# Patient Record
Sex: Male | Born: 1935 | Race: White | Hispanic: No | Marital: Married | Smoking: Former smoker
Health system: Southern US, Community
[De-identification: ages and names within clinical notes are randomized; demographics above are authoritative.]

## PROBLEM LIST (undated history)

## (undated) DIAGNOSIS — I10 Essential (primary) hypertension: Secondary | ICD-10-CM

## (undated) DIAGNOSIS — R519 Headache, unspecified: Secondary | ICD-10-CM

## (undated) DIAGNOSIS — F329 Major depressive disorder, single episode, unspecified: Secondary | ICD-10-CM

## (undated) DIAGNOSIS — R51 Headache: Secondary | ICD-10-CM

## (undated) DIAGNOSIS — I509 Heart failure, unspecified: Secondary | ICD-10-CM

## (undated) DIAGNOSIS — J45909 Unspecified asthma, uncomplicated: Secondary | ICD-10-CM

## (undated) DIAGNOSIS — G4730 Sleep apnea, unspecified: Secondary | ICD-10-CM

## (undated) DIAGNOSIS — G610 Guillain-Barre syndrome: Secondary | ICD-10-CM

## (undated) DIAGNOSIS — E119 Type 2 diabetes mellitus without complications: Secondary | ICD-10-CM

## (undated) DIAGNOSIS — I2699 Other pulmonary embolism without acute cor pulmonale: Secondary | ICD-10-CM

## (undated) DIAGNOSIS — I639 Cerebral infarction, unspecified: Secondary | ICD-10-CM

## (undated) DIAGNOSIS — I219 Acute myocardial infarction, unspecified: Secondary | ICD-10-CM

## (undated) DIAGNOSIS — M1990 Unspecified osteoarthritis, unspecified site: Secondary | ICD-10-CM

## (undated) DIAGNOSIS — F32A Depression, unspecified: Secondary | ICD-10-CM

## (undated) HISTORY — DX: Cerebral infarction, unspecified: I63.9

## (undated) HISTORY — DX: Heart failure, unspecified: I50.9

## (undated) HISTORY — PX: APPENDECTOMY: SHX54

---

## 2004-11-22 ENCOUNTER — Ambulatory Visit: Payer: Self-pay | Admitting: Internal Medicine

## 2007-04-29 ENCOUNTER — Ambulatory Visit: Admission: RE | Admit: 2007-04-29 | Discharge: 2007-04-29 | Payer: Self-pay | Admitting: Nurse Practitioner

## 2007-05-14 ENCOUNTER — Ambulatory Visit: Payer: Self-pay | Admitting: Pulmonary Disease

## 2010-06-07 ENCOUNTER — Ambulatory Visit: Payer: Self-pay | Admitting: Unknown Physician Specialty

## 2010-07-14 ENCOUNTER — Inpatient Hospital Stay: Payer: Self-pay | Admitting: Specialist

## 2010-08-14 HISTORY — PX: CERVICAL SPINE SURGERY: SHX589

## 2010-11-06 ENCOUNTER — Ambulatory Visit: Payer: Medicare Other | Attending: Nurse Practitioner

## 2010-11-06 DIAGNOSIS — G4733 Obstructive sleep apnea (adult) (pediatric): Secondary | ICD-10-CM | POA: Insufficient documentation

## 2010-11-06 DIAGNOSIS — Z7982 Long term (current) use of aspirin: Secondary | ICD-10-CM | POA: Insufficient documentation

## 2010-11-06 DIAGNOSIS — Z7902 Long term (current) use of antithrombotics/antiplatelets: Secondary | ICD-10-CM | POA: Insufficient documentation

## 2010-11-06 DIAGNOSIS — Z794 Long term (current) use of insulin: Secondary | ICD-10-CM | POA: Insufficient documentation

## 2010-11-06 DIAGNOSIS — I4949 Other premature depolarization: Secondary | ICD-10-CM | POA: Insufficient documentation

## 2010-11-22 ENCOUNTER — Ambulatory Visit: Payer: Medicare Other | Attending: Neurology

## 2010-11-22 DIAGNOSIS — Z794 Long term (current) use of insulin: Secondary | ICD-10-CM | POA: Insufficient documentation

## 2010-11-22 DIAGNOSIS — Z7982 Long term (current) use of aspirin: Secondary | ICD-10-CM | POA: Insufficient documentation

## 2010-11-22 DIAGNOSIS — Z7902 Long term (current) use of antithrombotics/antiplatelets: Secondary | ICD-10-CM | POA: Insufficient documentation

## 2010-11-22 DIAGNOSIS — I4949 Other premature depolarization: Secondary | ICD-10-CM | POA: Insufficient documentation

## 2010-11-22 DIAGNOSIS — G4733 Obstructive sleep apnea (adult) (pediatric): Secondary | ICD-10-CM | POA: Insufficient documentation

## 2010-11-29 NOTE — Procedures (Signed)
Colin Stokes, Colin Stokes              ACCOUNT NO.:  [id number]  MEDICAL RECORD NO.:  [id number]          PATIENT TYPE:  OUT  LOCATION:  SLEEP LAB                     FACILITY:  [facility]  PHYSICIAN:  Yaremi Stahlman A. Gerilyn Pilgrim, M.D. DATE OF BIRTH:  [birth year]  DATE OF STUDY:  11/22/2010                           NOCTURNAL POLYSOMNOGRAM  REFERRING PHYSICIAN:  Doristine Section  INDICATION FOR STUDY:  This is a 75 year old man who has had a previous study documented significant obstructive sleep apnea syndrome.  This is a titration study.  EPWORTH SLEEPINESS SCORE: 1. BMI 30.  MEDICATIONS:  Celexa, lisinopril, Lipitor, Plavix, metoprolol, aspirin, metformin, insulin, and ropinirole.  SLEEP ARCHITECTURE:  The total recording time is 403 minutes.  Sleep efficiency 84%, sleep latency 14 minutes, REM latency 92 minutes.  Stage N1 is 8% and N2 is 50%, N3 is 14% and REM sleep 28%.  RESPIRATORY DATA:  Baseline oxygen saturation is 96, lowest saturation 88 during REM sleep.  The patient was placed on positive pressure starting at a pressure of 5 and subsequently titrated to a pressure of 12.  The optimal pressure is 12 with resolution of obstructive event. He did tolerate this pressure well.  OXYGEN DATA:  CARDIAC DATA:  Average heart rate is 59 with some PVCs noted throughout the recording.  MOVEMENT-PARASOMNIA:  PLM index is 17.5.  The patient was also noted to have a fair amount of phasic EMG activity/fragmentary myoclonus activity, both within REM and nonREM sleep.  This has been associated with REM sleep behavior disorder.  IMPRESSIONS-RECOMMENDATIONS: 1. Obstructive sleep apnea syndrome which responded well to continuous     positive airway pressure of 12. 2. Moderate periodic limb movement disorder sleep. 3. Fragmentary myoclonus/phasic EMG activity. 4. Premature ventricular contractions.  CPAP of 12.  Thanks for this referral.     Maude Hettich A. Gerilyn Pilgrim, M.D. Electronically Signed 11/28/2010  12:27:14    KAD/MEDQ  D:  11/23/2010 09:81:19  T:  11/23/2010 23:00:54  Job:  [id number]

## 2010-12-27 NOTE — Procedures (Signed)
[name], Colin Stokes              ACCOUNT NO.:  [id number]   MEDICAL RECORD NO.:  [id number]          PATIENT TYPE:  OUT   LOCATION:  SLEEP LAB                     FACILITY:  [facility]   PHYSICIAN:  Barbaraann Share, MD,FCCPDATE OF BIRTH:  [birth year] 1938   DATE OF STUDY:  04/29/2007                            NOCTURNAL POLYSOMNOGRAM   REFERRING PHYSICIAN:  Ninfa Linden, M.D.   INDICATION FOR STUDY:  Hypersomnia with sleep apnea.   EPWORTH SLEEPINESS SCORE:  7   SLEEP ARCHITECTURE:  The patient had a total sleep time of 30 minutes  with adequate slow wave sleep but significantly decreased REM.  Sleep  onset latency was at the upper limits of normal, and REM onset was  prolonged at 200 minutes.  Sleep efficiency was decreased at 78%.   RESPIRATORY DATA:  The patient was found to have 62 obstructive  hypopneas and 43 obstructive apneas for an apnea/hypopnea index of 21  events per hour.  The events were not positional, and there was loud  snoring noted throughout.  The patient did not meet split night  criteria, secondary to most of the events occurring after 1:00 a.m.   OXYGEN DATA:  There was O2 desaturation as low as 72% with the patient's  obstructive events.   CARDIAC DATA:  Frequent PACs and PVCs were noted throughout.   MOVEMENT-PARASOMNIA:  The patient was found to have 183 leg jerks with  1.4 per hour, resulting in arousal or awakening.   IMPRESSIONS-RECOMMENDATIONS:  1. Moderate obstructive sleep apnea/hypopnea syndrome with an      apnea/hypopnea index of 21 events per hour and O2 desaturation as      low as 72%.  The patient did not meet split night criteria,      secondary to most of the events occurring after 1:00 a.m.      Treatment for this degree of sleep apnea can include weight loss      alone if applicable, upper airway surgery, oral appliance, and also      CPAP.  2. Frequent PVCs and PACs were noted throughout.  3. Large numbers of leg jerks with some degree of  sleep disruption.      It is unclear how much of this is related to the patient's sleep-      disordered breathing or whether he may have a concomitant primary      movement disorder at      sleep.  Clinical correlation is suggested, if the patient fails to      respond adequately to treatment of his obstructive sleep apnea.      Barbaraann Share, MD,FCCP  Diplomate, American Board of Sleep  Medicine  Electronically Signed     [facility]/MEDQ  D:  05/14/2007 07:48:59  T:  05/14/2007 09:53:59  Job:  [id number]

## 2013-06-26 ENCOUNTER — Ambulatory Visit: Payer: Self-pay | Admitting: Cardiology

## 2014-03-04 ENCOUNTER — Ambulatory Visit: Payer: Self-pay | Admitting: Unknown Physician Specialty

## 2014-03-05 LAB — PATHOLOGY REPORT

## 2015-09-28 ENCOUNTER — Inpatient Hospital Stay
Admission: AD | Admit: 2015-09-28 | Discharge: 2015-09-30 | DRG: 065 | Disposition: A | Discharge status: Home / Self Care | Payer: Medicare Other | Source: Ambulatory Visit | Attending: Internal Medicine | Admitting: Internal Medicine

## 2015-09-28 DIAGNOSIS — Z7982 Long term (current) use of aspirin: Secondary | ICD-10-CM | POA: Diagnosis not present

## 2015-09-28 DIAGNOSIS — Z87891 Personal history of nicotine dependence: Secondary | ICD-10-CM | POA: Diagnosis not present

## 2015-09-28 DIAGNOSIS — Z8249 Family history of ischemic heart disease and other diseases of the circulatory system: Secondary | ICD-10-CM | POA: Diagnosis not present

## 2015-09-28 DIAGNOSIS — Z794 Long term (current) use of insulin: Secondary | ICD-10-CM

## 2015-09-28 DIAGNOSIS — G650 Sequelae of Guillain-Barre syndrome: Secondary | ICD-10-CM | POA: Diagnosis present

## 2015-09-28 DIAGNOSIS — G6181 Chronic inflammatory demyelinating polyneuritis: Secondary | ICD-10-CM | POA: Diagnosis present

## 2015-09-28 DIAGNOSIS — M1990 Unspecified osteoarthritis, unspecified site: Secondary | ICD-10-CM | POA: Diagnosis present

## 2015-09-28 DIAGNOSIS — E119 Type 2 diabetes mellitus without complications: Secondary | ICD-10-CM | POA: Diagnosis present

## 2015-09-28 DIAGNOSIS — G610 Guillain-Barre syndrome: Secondary | ICD-10-CM | POA: Diagnosis present

## 2015-09-28 DIAGNOSIS — I63411 Cerebral infarction due to embolism of right middle cerebral artery: Secondary | ICD-10-CM | POA: Diagnosis not present

## 2015-09-28 DIAGNOSIS — I252 Old myocardial infarction: Secondary | ICD-10-CM | POA: Diagnosis not present

## 2015-09-28 DIAGNOSIS — I63531 Cerebral infarction due to unspecified occlusion or stenosis of right posterior cerebral artery: Principal | ICD-10-CM | POA: Diagnosis present

## 2015-09-28 DIAGNOSIS — F329 Major depressive disorder, single episode, unspecified: Secondary | ICD-10-CM | POA: Diagnosis present

## 2015-09-28 DIAGNOSIS — G4730 Sleep apnea, unspecified: Secondary | ICD-10-CM | POA: Diagnosis present

## 2015-09-28 DIAGNOSIS — J45909 Unspecified asthma, uncomplicated: Secondary | ICD-10-CM | POA: Diagnosis present

## 2015-09-28 DIAGNOSIS — I10 Essential (primary) hypertension: Secondary | ICD-10-CM | POA: Diagnosis present

## 2015-09-28 DIAGNOSIS — I2510 Atherosclerotic heart disease of native coronary artery without angina pectoris: Secondary | ICD-10-CM | POA: Diagnosis present

## 2015-09-28 DIAGNOSIS — Z79899 Other long term (current) drug therapy: Secondary | ICD-10-CM | POA: Diagnosis not present

## 2015-09-28 DIAGNOSIS — R531 Weakness: Secondary | ICD-10-CM

## 2015-09-28 DIAGNOSIS — I639 Cerebral infarction, unspecified: Secondary | ICD-10-CM

## 2015-09-28 HISTORY — DX: Type 2 diabetes mellitus without complications: E11.9

## 2015-09-28 HISTORY — DX: Depression, unspecified: F32.A

## 2015-09-28 HISTORY — DX: Acute myocardial infarction, unspecified: I21.9

## 2015-09-28 HISTORY — DX: Unspecified osteoarthritis, unspecified site: M19.90

## 2015-09-28 HISTORY — DX: Sleep apnea, unspecified: G47.30

## 2015-09-28 HISTORY — DX: Unspecified asthma, uncomplicated: J45.909

## 2015-09-28 HISTORY — DX: Headache, unspecified: R51.9

## 2015-09-28 HISTORY — DX: Headache: R51

## 2015-09-28 HISTORY — DX: Major depressive disorder, single episode, unspecified: F32.9

## 2015-09-28 LAB — CBC WITH DIFFERENTIAL/PLATELET
BASOS ABS: 0 10*3/uL (ref 0–0.1)
BASOS PCT: 1 %
EOS PCT: 3 %
Eosinophils Absolute: 0.2 10*3/uL (ref 0–0.7)
HCT: 38.7 % — ABNORMAL LOW (ref 40.0–52.0)
Hemoglobin: 12.8 g/dL — ABNORMAL LOW (ref 13.0–18.0)
LYMPHS PCT: 18 %
Lymphs Abs: 1.1 10*3/uL (ref 1.0–3.6)
MCH: 29.3 pg (ref 26.0–34.0)
MCHC: 33.2 g/dL (ref 32.0–36.0)
MCV: 88.4 fL (ref 80.0–100.0)
Monocytes Absolute: 0.7 10*3/uL (ref 0.2–1.0)
Monocytes Relative: 12 %
Neutro Abs: 4.1 10*3/uL (ref 1.4–6.5)
Neutrophils Relative %: 66 %
PLATELETS: 159 10*3/uL (ref 150–440)
RBC: 4.38 MIL/uL — AB (ref 4.40–5.90)
RDW: 14.1 % (ref 11.5–14.5)
WBC: 6.2 10*3/uL (ref 3.8–10.6)

## 2015-09-28 LAB — COMPREHENSIVE METABOLIC PANEL
ALK PHOS: 81 U/L (ref 38–126)
ALT: 28 U/L (ref 17–63)
ANION GAP: 9 (ref 5–15)
AST: 33 U/L (ref 15–41)
Albumin: 3.6 g/dL (ref 3.5–5.0)
BILIRUBIN TOTAL: 0.9 mg/dL (ref 0.3–1.2)
BUN: 25 mg/dL — AB (ref 6–20)
CALCIUM: 8.9 mg/dL (ref 8.9–10.3)
CO2: 28 mmol/L (ref 22–32)
Chloride: 101 mmol/L (ref 101–111)
Creatinine, Ser: 1.15 mg/dL (ref 0.61–1.24)
GFR calc Af Amer: >60 mL/min (ref >60)
GFR, EST NON AFRICAN AMERICAN: 60 mL/min — AB (ref >60)
Glucose, Bld: 171 mg/dL — ABNORMAL HIGH (ref 65–99)
POTASSIUM: 4.1 mmol/L (ref 3.5–5.1)
Sodium: 138 mmol/L (ref 135–145)
TOTAL PROTEIN: 6.7 g/dL (ref 6.5–8.1)

## 2015-09-28 LAB — GLUCOSE, CAPILLARY
GLUCOSE-CAPILLARY: 247 mg/dL — AB (ref 65–99)
Glucose-Capillary: 200 mg/dL — ABNORMAL HIGH (ref 65–99)

## 2015-09-28 MED ORDER — SODIUM CHLORIDE 0.9% FLUSH: 3.0000 mL | INTRAVENOUS | Status: DC | PRN

## 2015-09-28 MED ORDER — LOSARTAN POTASSIUM 50 MG PO TABS
100.0000 mg | ORAL_TABLET | Freq: Every day | ORAL | Status: DC
  Administered 2015-09-29 – 2015-09-30 (×2): 100 mg via ORAL
  Filled 2015-09-28 (×2): qty 2

## 2015-09-28 MED ORDER — ENOXAPARIN SODIUM 40 MG/0.4ML ~~LOC~~ SOLN
40.0000 mg | SUBCUTANEOUS | Status: DC
  Administered 2015-09-28 – 2015-09-30 (×3): 40 mg via SUBCUTANEOUS
  Filled 2015-09-28 (×3): qty 0.4

## 2015-09-28 MED ORDER — GABAPENTIN 400 MG PO CAPS
800.0000 mg | ORAL_CAPSULE | Freq: Every day | ORAL | Status: DC
  Administered 2015-09-28 – 2015-09-29 (×2): 800 mg via ORAL
  Filled 2015-09-28: qty 1
  Filled 2015-09-28 (×2): qty 2

## 2015-09-28 MED ORDER — INSULIN ASPART 100 UNIT/ML ~~LOC~~ SOLN: 0.0000 [IU] | Freq: Every day | SUBCUTANEOUS | Status: DC

## 2015-09-28 MED ORDER — ZOLPIDEM TARTRATE 5 MG PO TABS
5.0000 mg | ORAL_TABLET | Freq: Every evening | ORAL | Status: DC | PRN
  Administered 2015-09-28 – 2015-09-29 (×2): 5 mg via ORAL
  Filled 2015-09-28 (×2): qty 1

## 2015-09-28 MED ORDER — INSULIN ASPART 100 UNIT/ML ~~LOC~~ SOLN: 20.0000 [IU] | Freq: Every day | SUBCUTANEOUS | Status: DC

## 2015-09-28 MED ORDER — TRAMADOL HCL 50 MG PO TABS
50.0000 mg | ORAL_TABLET | Freq: Four times a day (QID) | ORAL | Status: DC | PRN
Start: 2015-09-28 — End: 2015-09-30

## 2015-09-28 MED ORDER — ACETAMINOPHEN 650 MG RE SUPP: 650.0000 mg | Freq: Four times a day (QID) | RECTAL | Status: DC | PRN

## 2015-09-28 MED ORDER — TRIAMCINOLONE ACETONIDE 0.1 % EX CREA
1.0000 "application " | TOPICAL_CREAM | Freq: Two times a day (BID) | CUTANEOUS | Status: DC
  Filled 2015-09-28: qty 15

## 2015-09-28 MED ORDER — IMMUNE GLOBULIN (HUMAN) 10 GM/100ML IV SOLN: 2.0000 g/kg | INTRAVENOUS | Status: DC

## 2015-09-28 MED ORDER — INSULIN DETEMIR 100 UNIT/ML ~~LOC~~ SOLN
36.0000 [IU] | Freq: Two times a day (BID) | SUBCUTANEOUS | Status: DC
  Administered 2015-09-28 – 2015-09-30 (×4): 36 [IU] via SUBCUTANEOUS
  Filled 2015-09-28 (×5): qty 0.36

## 2015-09-28 MED ORDER — INSULIN ASPART 100 UNIT/ML ~~LOC~~ SOLN
0.0000 [IU] | Freq: Three times a day (TID) | SUBCUTANEOUS | Status: DC
Start: 2015-09-28 — End: 2015-09-30
  Administered 2015-09-28: 5 [IU] via SUBCUTANEOUS
  Administered 2015-09-29 (×2): 3 [IU] via SUBCUTANEOUS
  Administered 2015-09-29: 5 [IU] via SUBCUTANEOUS
  Administered 2015-09-30: 3 [IU] via SUBCUTANEOUS
  Administered 2015-09-30: 2 [IU] via SUBCUTANEOUS
  Filled 2015-09-28 (×2): qty 3
  Filled 2015-09-28 (×2): qty 5
  Filled 2015-09-28: qty 2
  Filled 2015-09-28: qty 3

## 2015-09-28 MED ORDER — ALBUTEROL SULFATE (2.5 MG/3ML) 0.083% IN NEBU: 2.5000 mg | INHALATION_SOLUTION | Freq: Four times a day (QID) | RESPIRATORY_TRACT | Status: DC | PRN

## 2015-09-28 MED ORDER — SODIUM CHLORIDE 0.9 % IV SOLN: 250.0000 mL | INTRAVENOUS | Status: DC | PRN

## 2015-09-28 MED ORDER — PRAMIPEXOLE DIHYDROCHLORIDE 0.25 MG PO TABS
1.0000 mg | ORAL_TABLET | Freq: Three times a day (TID) | ORAL | Status: DC
  Administered 2015-09-28 – 2015-09-30 (×6): 1 mg via ORAL
  Filled 2015-09-28 (×6): qty 4

## 2015-09-28 MED ORDER — SODIUM CHLORIDE 0.9% FLUSH
3.0000 mL | Freq: Two times a day (BID) | INTRAVENOUS | Status: DC
  Administered 2015-09-29: 3 mL via INTRAVENOUS

## 2015-09-28 MED ORDER — ONDANSETRON HCL 4 MG/2ML IJ SOLN: 4.0000 mg | Freq: Four times a day (QID) | INTRAMUSCULAR | Status: DC | PRN

## 2015-09-28 MED ORDER — ASPIRIN EC 81 MG PO TBEC
81.0000 mg | DELAYED_RELEASE_TABLET | Freq: Every day | ORAL | Status: DC
  Administered 2015-09-29: 81 mg via ORAL
  Filled 2015-09-28: qty 1

## 2015-09-28 MED ORDER — GABAPENTIN 400 MG PO CAPS
500.0000 mg | ORAL_CAPSULE | Freq: Every day | ORAL | Status: DC
  Administered 2015-09-29 – 2015-09-30 (×2): 500 mg via ORAL
  Filled 2015-09-28 (×2): qty 1

## 2015-09-28 MED ORDER — PANTOPRAZOLE SODIUM 40 MG PO TBEC
40.0000 mg | DELAYED_RELEASE_TABLET | Freq: Every day | ORAL | Status: DC
  Administered 2015-09-29 – 2015-09-30 (×2): 40 mg via ORAL
  Filled 2015-09-28 (×2): qty 1

## 2015-09-28 MED ORDER — ATORVASTATIN CALCIUM 20 MG PO TABS
20.0000 mg | ORAL_TABLET | Freq: Every day | ORAL | Status: DC
  Administered 2015-09-29: 20 mg via ORAL
  Filled 2015-09-28: qty 1

## 2015-09-28 MED ORDER — ACETAMINOPHEN 325 MG PO TABS: 650.0000 mg | ORAL_TABLET | Freq: Four times a day (QID) | ORAL | Status: DC | PRN

## 2015-09-28 MED ORDER — INSULIN ASPART 100 UNIT/ML ~~LOC~~ SOLN
20.0000 [IU] | Freq: Every morning | SUBCUTANEOUS | Status: DC
  Administered 2015-09-29: 20 [IU] via SUBCUTANEOUS
  Filled 2015-09-28: qty 20

## 2015-09-28 MED ORDER — ONDANSETRON HCL 4 MG PO TABS: 4.0000 mg | ORAL_TABLET | Freq: Four times a day (QID) | ORAL | Status: DC | PRN

## 2015-09-28 MED ORDER — METFORMIN HCL 500 MG PO TABS
1000.0000 mg | ORAL_TABLET | Freq: Two times a day (BID) | ORAL | Status: DC
  Administered 2015-09-28 – 2015-09-30 (×4): 1000 mg via ORAL
  Filled 2015-09-28 (×4): qty 2

## 2015-09-28 MED ORDER — BISACODYL 5 MG PO TBEC: 5.0000 mg | DELAYED_RELEASE_TABLET | Freq: Every day | ORAL | Status: DC | PRN

## 2015-09-28 MED ORDER — IMMUNE GLOBULIN (HUMAN) 5 GM/50ML IV SOLN
500.0000 mg/kg | INTRAVENOUS | Status: DC
  Administered 2015-09-28: 55 g via INTRAVENOUS
  Filled 2015-09-28 (×2): qty 50

## 2015-09-28 MED ORDER — MAGNESIUM OXIDE 400 (241.3 MG) MG PO TABS
800.0000 mg | ORAL_TABLET | Freq: Every evening | ORAL | Status: DC
  Administered 2015-09-28 – 2015-09-29 (×2): 800 mg via ORAL
  Filled 2015-09-28 (×2): qty 2

## 2015-09-28 MED ORDER — CITALOPRAM HYDROBROMIDE 20 MG PO TABS
20.0000 mg | ORAL_TABLET | Freq: Every day | ORAL | Status: DC
  Administered 2015-09-29 – 2015-09-30 (×2): 20 mg via ORAL
  Filled 2015-09-28 (×2): qty 1

## 2015-09-28 MED ORDER — HYDROCHLOROTHIAZIDE 25 MG PO TABS
12.5000 mg | ORAL_TABLET | Freq: Every day | ORAL | Status: DC
  Administered 2015-09-29 – 2015-09-30 (×2): 12.5 mg via ORAL
  Filled 2015-09-28: qty 1
  Filled 2015-09-28: qty 2

## 2015-09-28 MED ORDER — DICLOFENAC SODIUM 1 % TD GEL
2.0000 g | Freq: Four times a day (QID) | TRANSDERMAL | Status: DC
  Filled 2015-09-28: qty 100

## 2015-09-28 MED ORDER — POLYETHYLENE GLYCOL 3350 17 G PO PACK: 17.0000 g | PACK | Freq: Every day | ORAL | Status: DC | PRN

## 2015-09-28 NOTE — H&P (Addendum)
[facility] - [place] at [facility]   PATIENT NAME: Colin Stokes    MR#:  [id number]  DATE OF BIRTH:  05/11/1936  DATE OF ADMISSION:  09/28/2015  PRIMARY CARE PHYSICIAN: SPARKS,JEFFREY D, MD   REQUESTING/REFERRING PHYSICIAN: Dr. Sherryll Burger  CHIEF COMPLAINT:  No chief complaint on file.  Weakness  HISTORY OF PRESENT ILLNESS:  Colin Stokes  is a 80 y.o. male with a known history of Guillain barre syndrome, CAD, HTN, DM here with vision changes, weakness, gait abnormalities and weakness. He was also briefly disoriented per wife. He went to ED in [place] and had CT head. Was seen by Dr. Sherryll Burger in office yesterday and sent as direct admission for IVIG.  Patient had Guillain barre 4 yrs back and spent 4 months at [facility] in ICU with Trach and PEG.   PAST MEDICAL HISTORY:   Past Medical History  Diagnosis Date  . Asthma   . Diabetes mellitus without complication (HCC)   . Depression   . Headache   . Arthritis   . Myocardial infarction (HCC)   . Sleep apnea     PAST SURGICAL HISTORY:   Past Surgical History  Procedure Laterality Date  . Appendectomy      SOCIAL HISTORY:   Social History  Substance Use Topics  . Smoking status: Former Games developer  . Smokeless tobacco: Not on file  . Alcohol Use: 0.0 oz/week    0 Standard drinks or equivalent per week    FAMILY HISTORY:   Family History  Problem Relation Age of Onset  . CAD      DRUG ALLERGIES:   Allergies  Allergen Reactions  . Penicillins Rash    REVIEW OF SYSTEMS:   Review of Systems  Constitutional: Positive for malaise/fatigue. Negative for fever, chills and weight loss.  HENT: Negative for hearing loss and nosebleeds.   Eyes: Positive for blurred vision and double vision. Negative for pain.  Respiratory: Negative for cough, hemoptysis, sputum production, shortness of breath and wheezing.   Cardiovascular: Negative for chest pain, palpitations, orthopnea and leg swelling.   Gastrointestinal: Negative for nausea, vomiting, abdominal pain, diarrhea and constipation.  Genitourinary: Negative for dysuria and hematuria.  Musculoskeletal: Negative for myalgias, back pain and falls.  Skin: Negative for rash.  Neurological: Positive for tingling, sensory change, focal weakness and headaches. Negative for dizziness, tremors, speech change and seizures.  Endo/Heme/Allergies: Does not bruise/bleed easily.  Psychiatric/Behavioral: Positive for hallucinations and memory loss. Negative for depression. The patient is not nervous/anxious.     MEDICATIONS AT HOME:   Prior to Admission medications   Medication Sig Start Date End Date Taking? Authorizing Provider  albuterol (PROVENTIL HFA;VENTOLIN HFA) 108 (90 Base) MCG/ACT inhaler Inhale 1 puff into the lungs every 6 (six) hours as needed for wheezing or shortness of breath.   Yes Historical Provider, MD  aspirin EC 81 MG tablet Take 81 mg by mouth daily.   Yes Historical Provider, MD  atorvastatin (LIPITOR) 20 MG tablet Take 20 mg by mouth daily.   Yes Historical Provider, MD  citalopram (CELEXA) 20 MG tablet Take 20 mg by mouth daily.   Yes Historical Provider, MD  colchicine 0.6 MG tablet Take 0.6 mg by mouth as needed.   Yes Historical Provider, MD  diclofenac sodium (VOLTAREN) 1 % GEL Apply topically 4 (four) times daily.   Yes Historical Provider, MD  gabapentin (NEURONTIN) 100 MG capsule Take 500 mg by mouth daily.   Yes Historical Provider, MD  hydrochlorothiazide (  HYDRODIURIL) 12.5 MG tablet Take 12.5 mg by mouth daily.   Yes Historical Provider, MD  insulin detemir (LEVEMIR) 100 UNIT/ML injection Inject 36 Units into the skin 2 (two) times daily.   Yes Historical Provider, MD  insulin regular (NOVOLIN R,HUMULIN R) 100 units/mL injection Inject 30 Units into the skin every morning.   Yes Historical Provider, MD  insulin regular (NOVOLIN R,HUMULIN R) 100 units/mL injection Inject 34 Units into the skin at bedtime.   Yes  Historical Provider, MD  lidocaine-prilocaine (EMLA) cream Apply 1 application topically as needed.   Yes Historical Provider, MD  losartan (COZAAR) 100 MG tablet Take 100 mg by mouth daily.   Yes Historical Provider, MD  magnesium oxide (MAG-OX) 400 MG tablet Take 800 mg by mouth every evening.   Yes Historical Provider, MD  metFORMIN (GLUCOPHAGE) 1000 MG tablet Take 1,000 mg by mouth 2 (two) times daily with a meal.   Yes Historical Provider, MD  omeprazole (PRILOSEC) 20 MG capsule Take 20 mg by mouth daily.   Yes Historical Provider, MD  pramipexole (MIRAPEX) 1 MG tablet Take 1 mg by mouth 3 (three) times daily.   Yes Historical Provider, MD  traMADol (ULTRAM) 50 MG tablet Take 50 mg by mouth every 6 (six) hours as needed.   Yes Historical Provider, MD  triamcinolone cream (KENALOG) 0.1 % Apply 1 application topically 2 (two) times daily.   Yes Historical Provider, MD      VITAL SIGNS:  Blood pressure 140/79, pulse 93, temperature 97.8 F (36.6 C), temperature source Oral, resp. rate 18, height  (1.803 m), weight 110.542 kg (243 lb 11.2 oz), SpO2 94 %.  PHYSICAL EXAMINATION:  Physical Exam  GENERAL:  80 y.o.-year-old patient lying in the bed with no acute distress.  EYES: Pupils equal, round, reactive to light and accommodation. No scleral icterus. Extraocular muscles intact.  HEENT: Head atraumatic, normocephalic. Oropharynx and nasopharynx clear. No oropharyngeal erythema, moist oral mucosa  NECK:  Supple, no jugular venous distention. No thyroid enlargement, no tenderness.  LUNGS: Normal breath sounds bilaterally, no wheezing, rales, rhonchi. No use of accessory muscles of respiration.  CARDIOVASCULAR: S1, S2 normal. No murmurs, rubs, or gallops.  ABDOMEN: Soft, nontender, nondistended. Bowel sounds present. No organomegaly or mass.  EXTREMITIES: No pedal edema, cyanosis, or clubbing. + 2 pedal & radial pulses b/l.   NEUROLOGIC: Cranial nerves II through XII are intact. No  focal Motor deficits appreciated b/l. Decreased sensations symmetrical LE PSYCHIATRIC: The patient is alert and oriented x 3. Good affect.  SKIN: No obvious rash, lesion, or ulcer.   LABORATORY PANEL:   CBC No results for input(s): WBC, HGB, HCT, PLT in the last 168 hours. ------------------------------------------------------------------------------------------------------------------  Chemistries  No results for input(s): NA, K, CL, CO2, GLUCOSE, BUN, CREATININE, CALCIUM, MG, AST, ALT, ALKPHOS, BILITOT in the last 168 hours.  Invalid input(s): GFRCGP ------------------------------------------------------------------------------------------------------------------  Cardiac Enzymes No results for input(s): TROPONINI in the last 168 hours. ------------------------------------------------------------------------------------------------------------------  RADIOLOGY:  No results found.   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 Discussed with Dr. Sherryll Burger. Start IVIG 2 mg/kg/day x 4 doses. Monitor symptoms.  * DM SSI, ADA diet  * CAD Stable Continue meds  * Asthma Stable  * DVT prophylaxis Lovenox   All the records are reviewed and case discussed with ED provider. Management plans discussed with the patient, family and they are in agreement.  CODE STATUS: FULL  TOTAL TIME TAKING CARE OF THIS PATIENT:  40 minutes.    Milagros Loll R M.D on 09/28/2015 at 1:54 PM  Between 7am to 6pm - Pager - [phone]  After 6pm go to [email or web address] - password [email or web address] [facility]  [facility]  Office  [phone]  CC: Primary care physician; Marguarite Arbour, MD   Note: This dictation was prepared with Dragon dictation along with smaller phrase technology. Any transcriptional errors that result from this process are unintentional.

## 2015-09-28 NOTE — Progress Notes (Signed)
Paged dr sudini. Pr having increased restless legs. Also he is missing his night time order for Neurontin and he is requesting something for sleep. New orders received

## 2015-09-29 ENCOUNTER — Inpatient Hospital Stay: Admission: AD | Admit: 2015-09-29 | Payer: Medicare Other | Source: Ambulatory Visit | Admitting: Internal Medicine

## 2015-09-29 ENCOUNTER — Inpatient Hospital Stay: Payer: Medicare Other

## 2015-09-29 ENCOUNTER — Encounter: Payer: Self-pay | Admitting: Radiology

## 2015-09-29 DIAGNOSIS — I63411 Cerebral infarction due to embolism of right middle cerebral artery: Secondary | ICD-10-CM

## 2015-09-29 LAB — GLUCOSE, CAPILLARY
GLUCOSE-CAPILLARY: 175 mg/dL — AB (ref 65–99)
Glucose-Capillary: 161 mg/dL — ABNORMAL HIGH (ref 65–99)
Glucose-Capillary: 204 mg/dL — ABNORMAL HIGH (ref 65–99)
Glucose-Capillary: 153 mg/dL — ABNORMAL HIGH (ref 65–99)
Glucose-Capillary: 111 mg/dL — ABNORMAL HIGH (ref 65–99)

## 2015-09-29 MED ORDER — ATORVASTATIN CALCIUM 20 MG PO TABS
40.0000 mg | ORAL_TABLET | Freq: Every day | ORAL | Status: DC
  Administered 2015-09-30: 40 mg via ORAL
  Filled 2015-09-29: qty 2

## 2015-09-29 MED ORDER — GADOBENATE DIMEGLUMINE 529 MG/ML IV SOLN
20.0000 mL | Freq: Once | INTRAVENOUS | Status: AC | PRN
  Administered 2015-09-29: 20 mL via INTRAVENOUS

## 2015-09-29 MED ORDER — TRIAMCINOLONE ACETONIDE 0.1 % EX CREA
1.0000 "application " | TOPICAL_CREAM | Freq: Two times a day (BID) | CUTANEOUS | Status: DC | PRN
  Filled 2015-09-29: qty 15

## 2015-09-29 MED ORDER — ASPIRIN EC 325 MG PO TBEC
325.0000 mg | DELAYED_RELEASE_TABLET | Freq: Every day | ORAL | Status: DC
  Administered 2015-09-29 – 2015-09-30 (×2): 325 mg via ORAL
  Filled 2015-09-29 (×2): qty 1

## 2015-09-29 MED ORDER — DICLOFENAC SODIUM 1 % TD GEL
2.0000 g | Freq: Four times a day (QID) | TRANSDERMAL | Status: DC | PRN
  Filled 2015-09-29: qty 100

## 2015-09-29 MED ORDER — IOHEXOL 350 MG/ML SOLN
100.0000 mL | Freq: Once | INTRAVENOUS | Status: AC | PRN
  Administered 2015-09-29: 100 mL via INTRAVENOUS

## 2015-09-29 MED ORDER — INSULIN ASPART 100 UNIT/ML ~~LOC~~ SOLN
15.0000 [IU] | Freq: Three times a day (TID) | SUBCUTANEOUS | Status: DC
  Administered 2015-09-29 – 2015-09-30 (×4): 15 [IU] via SUBCUTANEOUS
  Filled 2015-09-29 (×4): qty 15

## 2015-09-29 NOTE — Progress Notes (Signed)
[facility] - [place] at [facility]   PATIENT NAME: Colin Stokes    MR#:  [id number]  DATE OF BIRTH:  20-Sep-1935  SUBJECTIVE:  CHIEF COMPLAINT:  Patient is resting comfortably but reporting blurry vision, sometimes double vision and lower extremity weakness with tingling and numbness. Denies any dysphagia or dysarthria. Tolerating diet fine according to the nurse's report.  REVIEW OF SYSTEMS:  CONSTITUTIONAL: No fever, fatigue or weakness.  EYES: No blurred or double vision.  EARS, NOSE, AND THROAT: No tinnitus or ear pain.  RESPIRATORY: No cough, shortness of breath, wheezing or hemoptysis.  CARDIOVASCULAR: No chest pain, orthopnea, edema.  GASTROINTESTINAL: No nausea, vomiting, diarrhea or abdominal pain.  GENITOURINARY: No dysuria, hematuria.  ENDOCRINE: No polyuria, nocturia,  HEMATOLOGY: No anemia, easy bruising or bleeding SKIN: No rash or lesion. MUSCULOSKELETAL: No joint pain or arthritis.   NEUROLOGIC: Reporting tingling, numbness, weakness in lower extremities and blurry vision PSYCHIATRY: No anxiety or depression.   DRUG ALLERGIES:   Allergies  Allergen Reactions  . Penicillins Rash    VITALS:  Blood pressure 151/77, pulse 94, temperature 98.4 F (36.9 C), temperature source Oral, resp. rate 19, height 5\' 11"  (1.803 m), weight 110.542 kg (243 lb 11.2 oz), SpO2 95 %.  PHYSICAL EXAMINATION:  GENERAL:  80 y.o.-year-old patient lying in the bed with no acute distress.  EYES: Pupils equal, round, reactive to light and accommodation. No scleral icterus. Extraocular muscles intact.  HEENT: Head atraumatic, normocephalic. Oropharynx and nasopharynx clear.  NECK:  Supple, no jugular venous distention. No thyroid enlargement, no tenderness.  LUNGS: Normal breath sounds bilaterally, no wheezing, rales,rhonchi or crepitation. No use of accessory muscles of respiration.  CARDIOVASCULAR: S1, S2 normal. No murmurs, rubs, or gallops.  ABDOMEN: Soft,  nontender, nondistended. Bowel sounds present. No organomegaly or mass.  EXTREMITIES: No pedal edema, cyanosis, or clubbing.  NEUROLOGIC: Cranial nerves II through XII are intact. Muscle strength 5/5 in all extremities. Decreased touch sensation. Gait not checked.  PSYCHIATRIC: The patient is alert and oriented x 3.  SKIN: No obvious rash, lesion, or ulcer.    LABORATORY PANEL:   CBC  Recent Labs Lab 09/28/15 1347  WBC 6.2  HGB 12.8*  HCT 38.7*  PLT 159   ------------------------------------------------------------------------------------------------------------------  Chemistries   Recent Labs Lab 09/28/15 1347  NA 138  K 4.1  CL 101  CO2 28  GLUCOSE 171*  BUN 25*  CREATININE 1.15  CALCIUM 8.9  AST 33  ALT 28  ALKPHOS 81  BILITOT 0.9   ------------------------------------------------------------------------------------------------------------------  Cardiac Enzymes No results for input(s): TROPONINI in the last 168 hours. ------------------------------------------------------------------------------------------------------------------  RADIOLOGY:  Ct Angio Head W/cm &/or Wo Cm  09/29/2015  CLINICAL DATA:  80 year old male with acute right PCA infarct on MRI for weakness, visual changes, gait abnormality. Initial encounter. EXAM: CT ANGIOGRAPHY HEAD AND NECK TECHNIQUE: Multidetector CT imaging of the head and neck was performed using the standard protocol during bolus administration of intravenous contrast. Multiplanar CT image reconstructions and MIPs were obtained to evaluate the vascular anatomy. Carotid stenosis measurements (when applicable) are obtained utilizing NASCET criteria, using the distal internal carotid diameter as the denominator. CONTRAST:  OMNIPAQUE IOHEXOL 350 MG/ML SOLN COMPARISON:  Brain MRI 1151 hours today. FINDINGS: CT HEAD Brain: Patchy hypodensity in the right PCA territory corresponding to areas of restricted diffusion on the earlier  MRI. No associated hemorrhage or mass effect. No ventriculomegaly. Gray-white matter differentiation elsewhere is within normal limits. Calvarium and skull base:  No acute osseous abnormality identified. Paranasal sinuses: Clear. Orbits: Visualized orbits and scalp soft tissues are within normal limits. CTA NECK Skeleton: Previous posterior cervical spine decompression from the C3 to the C7 level. Chronic disc, endplate, and facet hypertrophy. Osteopenia. No acute osseous abnormality identified. Other neck: Mild motion artifact in the upper chest. No superior mediastinal lymphadenopathy. Thyroid, larynx, pharynx, parapharyngeal spaces, retropharyngeal space (retropharyngeal carotids), sublingual space, submandibular glands, and parotid glands are within normal limits. No cervical lymphadenopathy. Mildly conspicuous but nonspecific left level 1B lymph nodes. Aortic arch: 4 vessel arch configuration, with the left vertebral artery arising directly from the arch. Mild for age calcified arch atherosclerosis. Right carotid system: No brachiocephalic or right CCA origin stenosis. Mild motion artifact at the thoracic inlet. Retropharyngeal course of the right CCA and right carotid bifurcation with minimal calcified plaque. Tortuous cervical right ICA, no stenosis in the neck. Left carotid system: Mild calcified plaque in the proximal left CCA without stenosis. Partially retropharyngeal course. Widely patent left carotid bifurcation. Mildly dolichoectatic but otherwise negative cervical left ICA. Vertebral arteries: Calcified plaque in the proximal right subclavian artery without high-grade stenosis. Patent right vertebral artery origin with no stenosis evident. Otherwise negative cervical right vertebral artery. The left vertebral artery arises directly from the arch with no origin stenosis. It is tortuous in the V1 segment with a mildly late entry into the left cervical transverse foramen. No cervical left vertebral  artery stenosis. CTA HEAD Posterior circulation: Fairly codominant distal vertebral arteries. Calcified plaque in both V4 segments, greater on the right. This is not hemodynamically significant. Normal PICA origins and vertebrobasilar junction. There is mild irregularity and stenosis in the proximal basilar artery. The basilar remains patent. The SCA and PCA origins are within normal limits. Posterior communicating arteries are diminutive or absent. The left posterior communicating artery is present, the right is diminutive or absent. There is mild to moderate irregularity in the left PCA P2 segment with preserved distal flow. There is severe irregularity involving a 7 mm segment of the right P2 with high-grade stenosis, but distal enhancement is preserved. Anterior circulation: Both ICA siphons are patent with calcified plaque distally greater on the left. This does not appear hemodynamically significant. Ophthalmic and left posterior communicating artery origins are normal. Carotid termini are patent. MCA and ACA origins are normal. Anterior communicating artery and visualized ACA branches are within normal limits. There is a median artery of the corpus callosum. Left MCA M1 and trifurcation are within normal limits aside from mild dolichoectasia. Left MCA branches are within normal limits. Right MCA M1 segment, bifurcation, and right MCA branches are within normal limits. Venous sinuses: Patent. Anatomic variants: Left vertebral artery arises directly from the arch. Delayed phase: No abnormal enhancement identified. IMPRESSION: 1. Approximately 7 mm segment of severe irregularity and stenosis in the right PCA P2 segment corresponding to the acute right PCA infarct. 2. Upstream mild Distal vertebral artery and basilar atherosclerosis. There is mild to moderate irregularity and stenosis of the left PCA P2 segment. Note that the left vertebral artery arises directly from the aortic arch (normal variant). 3. Mild  calcified plaque involving the carotid arteries and anterior circulation with no associated stenosis. 4. Expected CT appearance of the acute right PCA infarcts seen by MRI today. No associated hemorrhage or mass effect. 5. No acute findings in the neck. Electronically Signed   By: Odessa Fleming M.D.   On: 09/29/2015 15:52   Ct Angio Neck W/cm &/or Wo/cm  09/29/2015  CLINICAL DATA:  80 year old male with acute right PCA infarct on MRI for weakness, visual changes, gait abnormality. Initial encounter. EXAM: CT ANGIOGRAPHY HEAD AND NECK TECHNIQUE: Multidetector CT imaging of the head and neck was performed using the standard protocol during bolus administration of intravenous contrast. Multiplanar CT image reconstructions and MIPs were obtained to evaluate the vascular anatomy. Carotid stenosis measurements (when applicable) are obtained utilizing NASCET criteria, using the distal internal carotid diameter as the denominator. CONTRAST:  OMNIPAQUE IOHEXOL 350 MG/ML SOLN COMPARISON:  Brain MRI 1151 hours today. FINDINGS: CT HEAD Brain: Patchy hypodensity in the right PCA territory corresponding to areas of restricted diffusion on the earlier MRI. No associated hemorrhage or mass effect. No ventriculomegaly. Gray-white matter differentiation elsewhere is within normal limits. Calvarium and skull base:  No acute osseous abnormality identified. Paranasal sinuses: Clear. Orbits: Visualized orbits and scalp soft tissues are within normal limits. CTA NECK Skeleton: Previous posterior cervical spine decompression from the C3 to the C7 level. Chronic disc, endplate, and facet hypertrophy. Osteopenia. No acute osseous abnormality identified. Other neck: Mild motion artifact in the upper chest. No superior mediastinal lymphadenopathy. Thyroid, larynx, pharynx, parapharyngeal spaces, retropharyngeal space (retropharyngeal carotids), sublingual space, submandibular glands, and parotid glands are within normal limits. No cervical  lymphadenopathy. Mildly conspicuous but nonspecific left level 1B lymph nodes. Aortic arch: 4 vessel arch configuration, with the left vertebral artery arising directly from the arch. Mild for age calcified arch atherosclerosis. Right carotid system: No brachiocephalic or right CCA origin stenosis. Mild motion artifact at the thoracic inlet. Retropharyngeal course of the right CCA and right carotid bifurcation with minimal calcified plaque. Tortuous cervical right ICA, no stenosis in the neck. Left carotid system: Mild calcified plaque in the proximal left CCA without stenosis. Partially retropharyngeal course. Widely patent left carotid bifurcation. Mildly dolichoectatic but otherwise negative cervical left ICA. Vertebral arteries: Calcified plaque in the proximal right subclavian artery without high-grade stenosis. Patent right vertebral artery origin with no stenosis evident. Otherwise negative cervical right vertebral artery. The left vertebral artery arises directly from the arch with no origin stenosis. It is tortuous in the V1 segment with a mildly late entry into the left cervical transverse foramen. No cervical left vertebral artery stenosis. CTA HEAD Posterior circulation: Fairly codominant distal vertebral arteries. Calcified plaque in both V4 segments, greater on the right. This is not hemodynamically significant. Normal PICA origins and vertebrobasilar junction. There is mild irregularity and stenosis in the proximal basilar artery. The basilar remains patent. The SCA and PCA origins are within normal limits. Posterior communicating arteries are diminutive or absent. The left posterior communicating artery is present, the right is diminutive or absent. There is mild to moderate irregularity in the left PCA P2 segment with preserved distal flow. There is severe irregularity involving a 7 mm segment of the right P2 with high-grade stenosis, but distal enhancement is preserved. Anterior circulation: Both  ICA siphons are patent with calcified plaque distally greater on the left. This does not appear hemodynamically significant. Ophthalmic and left posterior communicating artery origins are normal. Carotid termini are patent. MCA and ACA origins are normal. Anterior communicating artery and visualized ACA branches are within normal limits. There is a median artery of the corpus callosum. Left MCA M1 and trifurcation are within normal limits aside from mild dolichoectasia. Left MCA branches are within normal limits. Right MCA M1 segment, bifurcation, and right MCA branches are within normal limits. Venous sinuses: Patent. Anatomic variants: Left vertebral artery arises directly from  the arch. Delayed phase: No abnormal enhancement identified. IMPRESSION: 1. Approximately 7 mm segment of severe irregularity and stenosis in the right PCA P2 segment corresponding to the acute right PCA infarct. 2. Upstream mild Distal vertebral artery and basilar atherosclerosis. There is mild to moderate irregularity and stenosis of the left PCA P2 segment. Note that the left vertebral artery arises directly from the aortic arch (normal variant). 3. Mild calcified plaque involving the carotid arteries and anterior circulation with no associated stenosis. 4. Expected CT appearance of the acute right PCA infarcts seen by MRI today. No associated hemorrhage or mass effect. 5. No acute findings in the neck. Electronically Signed   By: Odessa Fleming M.D.   On: 09/29/2015 15:52   Mr Laqueta Jean ZO Contrast  09/29/2015  CLINICAL DATA:  Weakness, and gait abnormality, and visual changes. Brief disorientation. History of Guillain-Barre syndrome. EXAM: MRI HEAD WITHOUT AND WITH CONTRAST TECHNIQUE: Multiplanar, multiecho pulse sequences of the brain and surrounding structures were obtained without and with intravenous contrast. CONTRAST:  20mL MULTIHANCE GADOBENATE DIMEGLUMINE 529 MG/ML IV SOLN COMPARISON:  None. FINDINGS: There is right-sided posterior  circulation restricted diffusion consistent with acute infarction involving the mesial right temporal lobe and right occipital lobe. There is associated cytotoxic edema without mass effect. There is no evidence of intracranial hemorrhage, mass, midline shift, or extra-axial fluid collection. There is mild generalized cerebral atrophy. Small foci of T2 hyperintensity scattered throughout the cerebral white matter bilaterally are nonspecific but compatible with mild chronic small vessel ischemic disease. No abnormal enhancement is identified. Orbits are unremarkable. The been no significant inflammatory disease is seen in the paranasal sinuses are mastoid air cells. Major intracranial vascular flow voids are grossly preserved, although there is suggestion of abnormal FLAIR hyperintensity in the right PCA which may reflect slow flow or occlusion related to the acute infarct. A prior incision is noted in the midline soft tissues of the upper neck posteriorly. IMPRESSION: Acute right PCA infarct involving the temporal and occipital lobes. Electronically Signed   By: Sebastian Ache M.D.   On: 09/29/2015 12:56    EKG:   Orders placed or performed in visit on 07/14/10  . EKG 12-Lead  . EKG 12-Lead  . 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 Discussed with Dr. Sherryll Burger. Started patient on IVIG 2 mg/kg/day at the time of admission but it was discontinued as the patient has acute stroke, per neurology recommendations Monitor  symptoms.  * DM SSI, ADA diet   * CAD Stable Continue meds  * Asthma Stable  * DVT prophylaxis Lovenox    All the records are reviewed and case discussed with RN Dava, Care Management/Social Workerr. Management plans discussed with the patient, family and they are in agreement.  CODE STATUS: fc   TOTAL TIME TAKING CARE OF THIS PATIENT: 35  minutes.   POSSIBLE D/C IN 1-2  DAYS, DEPENDING ON CLINICAL CONDITION.   Ramonita Lab M.D on 09/29/2015 at 4:28 PM  Between 7am to 6pm - Pager - [phone] After 6pm  go to [email or web address] - password [email or web address] [facility]  [facility]  Office  [phone]  CC: Primary care physician; Marguarite Arbour, MD

## 2015-09-29 NOTE — Evaluation (Signed)
Physical Therapy Evaluation Patient Details Name: Colin Stokes MRN: [id number] DOB: 1935-12-29 Today's Date: 09/29/2015   History of Present Illness  Pt admitted for complaints of weakness with vison/gait deficits. Pt with history of Guillian Barre and CIDP. Pt now diagnosed with positive CVA in R PCA. Pt with history of CAD, HTN, and DM.   Clinical Impression  Pt is a pleasant 80 year old male who was admitted for possible CIDP but now is ruled in for positive CVA. Pt performs bed mobility/transfers independently and ambulation with cga and no AD. Post ambulation, pt very fatigued with O2 sats at 86% on room air. 2L of O2 reapplied. Pt demonstrates deficits with balance/mobility/endurance. Pt with negative heel->shin, finger opposition, and finger->nose testing. No changes to B UE/LE sensation. Would benefit from skilled PT to address above deficits and promote optimal return to PLOF.      Follow Up Recommendations Outpatient PT (want to use OP close to home)    Equipment Recommendations  None recommended by PT    Recommendations for Other Services       Precautions / Restrictions Precautions Precautions: Fall Restrictions Weight Bearing Restrictions: No      Mobility  Bed Mobility Overal bed mobility: Independent             General bed mobility comments: safe technique performed  Transfers Overall transfer level: Independent Equipment used: None             General transfer comment: safe technique performed without use of AD.  Ambulation/Gait Ambulation/Gait assistance: Min guard Ambulation Distance (Feet): 200 Feet Assistive device: None Gait Pattern/deviations: Step-through pattern     General Gait Details: Pt ambulates with wide BOS and slow gait speed. Pt unsteady and frequently loses balance in post lateral direction with changes in direction. Pt able to correct LOB with cga. Pt also grabbing towards railing/walls occasionally.   Stairs             Wheelchair Mobility    Modified Rankin (Stroke Patients Only)       Balance Overall balance assessment: History of Falls;Needs assistance Sitting-balance support: Feet supported Sitting balance-Leahy Scale: Normal     Standing balance support: No upper extremity supported Standing balance-Leahy Scale: Fair Standing balance comment: increased sway during static stance and multiple LOB during dynamic testing                             Pertinent Vitals/Pain Pain Assessment: Faces Faces Pain Scale: Hurts a little bit Pain Location: Plantar surfaces of B feet with weight bearing Pain Descriptors / Indicators: Burning Pain Intervention(s): Limited activity within patient's tolerance    Home Living Family/patient expects to be discharged to:: Private residence Living Arrangements: Spouse/significant other Available Help at Discharge: Family Type of Home: House Home Access: Ramped entrance     Home Layout: One level Home Equipment: Environmental consultant - 2 wheels;Bedside commode;Shower seat      Prior Function Level of Independence: Independent         Comments: was previously playing golf     Hand Dominance        Extremity/Trunk Assessment   Upper Extremity Assessment: Overall WFL for tasks assessed           Lower Extremity Assessment: Overall WFL for tasks assessed         Communication   Communication: No difficulties  Cognition Arousal/Alertness: Awake/alert Behavior During Therapy: WFL for tasks assessed/performed Overall  Cognitive Status: Within Functional Limits for tasks assessed                      General Comments      Exercises Other Exercises Other Exercises: Pt ambulated to bathroom and needs cga to maintain balance while using toilet. Supervision given for personal hygiene.      Assessment/Plan    PT Assessment Patient needs continued PT services  PT Diagnosis Difficulty walking;Abnormality of gait   PT  Problem List Decreased balance;Decreased mobility;Decreased coordination  PT Treatment Interventions Gait training;DME instruction;Functional mobility training;Balance training   PT Goals (Current goals can be found in the Care Plan section) Acute Rehab PT Goals Patient Stated Goal: to play golf PT Goal Formulation: With patient Time For Goal Achievement: 10/13/15 Potential to Achieve Goals: Good    Frequency 7X/week   Barriers to discharge        Co-evaluation               End of Session Equipment Utilized During Treatment: Gait belt;Oxygen Activity Tolerance: Patient tolerated treatment well Patient left: in bed;with bed alarm set Nurse Communication: Mobility status         Time: 0102-7253 PT Time Calculation (min) (ACUTE ONLY): 22 min   Charges:   PT Evaluation $PT Eval Moderate Complexity: 1 Procedure PT Treatments $Therapeutic Activity: 8-22 mins   PT G Codes:        [name] Oct 15, 2015, 4:49 PM  Elizabeth Palau, PT, DPT [phone]

## 2015-09-29 NOTE — Progress Notes (Signed)
Inpatient Diabetes Program Recommendations  AACE/ADA: New Consensus Statement on Inpatient Glycemic Control (2015)  Target Ranges:  Prepandial:   less than 140 mg/dL      Peak postprandial:   less than 180 mg/dL (1-2 hours)      Critically ill patients:  140 - 180 mg/dL   Review of Glycemic Control:  Results for KAYSHAUN, POLANCO (MRN [id number]) as of 09/29/2015 10:48  Ref. Range 09/28/2015 16:06 09/28/2015 21:05 09/29/2015 07:46 09/29/2015 09:18  Glucose-Capillary Latest Ref Range: 65-99 mg/dL 409 (H) 811 (H) 914 (H) 204 (H)   Diabetes history: Type 2 diabetes Outpatient Diabetes medications: Levemir 36 units bid, Novolin R- 30 units q AM and 34 units q HS Current orders for Inpatient glycemic control:  Novolog moderate tid with meals and HS, Levemir 36 units bid, Novolog 20 units q 10 am, Novolog 20 units q HS  Inpatient Diabetes Program Recommendations:    Please discontinue Novolog at HS.  Consider changing times for Novolog to occur with meals.  Consider Novolog meal coverage 15 units tid with meals- therefore if patient does not eat more that 50%, Novolog meal coverage can be held.  Thanks, Beryl Meager, RN, BC-ADM Inpatient Diabetes Coordinator Pager [phone] (8a-5p)

## 2015-09-29 NOTE — Consult Note (Signed)
CC: LE weakness   HPI: Colin Stokes is an 80 y.o. male male with a known history of Guillain barre syndrome, CAD, HTN, DM here with vision changes, weakness, gait abnormalities and weakness. He was also briefly disoriented per wife. He went to ED in [place] and had CT head. Was seen by Dr. Sherryll Burger in office yesterday and sent as direct admission for IVIG.  Pt states he ws treated at [facility] bout 4 yrs ago, at that time had b/l LE numbness which is similar to what he had at that time.    Past Medical History  Diagnosis Date  . Asthma   . Diabetes mellitus without complication (HCC)   . Depression   . Headache   . Arthritis   . Myocardial infarction (HCC)   . Sleep apnea     Past Surgical History  Procedure Laterality Date  . Appendectomy      Family History  Problem Relation Age of Onset  . CAD      Social History:  reports that he has quit smoking. He does not have any smokeless tobacco history on file. He reports that he drinks alcohol. His drug history is not on file.  Allergies  Allergen Reactions  . Penicillins Rash    Medications: I have reviewed the patient's current medications.  ROS: History obtained from the patient  General ROS: negative for - chills, fatigue, fever, night sweats, weight gain or weight loss Psychological ROS: negative for - behavioral disorder, hallucinations, memory difficulties, mood swings or suicidal ideation Ophthalmic ROS: negative for - blurry vision, double vision, eye pain or loss of vision ENT ROS: negative for - epistaxis, nasal discharge, oral lesions, sore throat, tinnitus or vertigo Allergy and Immunology ROS: negative for - hives or itchy/watery eyes Hematological and Lymphatic ROS: negative for - bleeding problems, bruising or swollen lymph nodes Endocrine ROS: negative for - galactorrhea, hair pattern changes, polydipsia/polyuria or temperature intolerance Respiratory ROS: negative for - cough, hemoptysis, shortness of  breath or wheezing Cardiovascular ROS: negative for - chest pain, dyspnea on exertion, edema or irregular heartbeat Gastrointestinal ROS: negative for - abdominal pain, diarrhea, hematemesis, nausea/vomiting or stool incontinence Genito-Urinary ROS: negative for - dysuria, hematuria, incontinence or urinary frequency/urgency Musculoskeletal ROS: negative for - joint swelling or muscular weakness Neurological ROS: as noted in HPI, positive history of GBS Dermatological ROS: negative for rash and skin lesion changes  Physical Examination: Blood pressure 177/95, pulse 82, temperature 97.1 F (36.2 C), temperature source Oral, resp. rate 19, height  (1.803 m), weight 243 lb 11.2 oz (110.542 kg), SpO2 92 %.    Neurological Examination Mental Status: Alert, oriented, thought content appropriate.  Speech fluent without evidence of aphasia.  Able to follow 3 step commands without difficulty. Cranial Nerves: II: Discs flat bilaterally; Visual fields grossly normal, pupils equal, round, reactive to light and accommodation III,IV, VI: ptosis not present, extra-ocular motions intact bilaterally V,VII: smile symmetric, facial light touch sensation normal bilaterally VIII: hearing normal bilaterally IX,X: gag reflex present XI: bilateral shoulder shrug XII: midline tongue extension Motor: Right : Upper extremity   5/5    Left:     Upper extremity   5/5  Lower extremity   5/5     Lower extremity   5/5 Tone and bulk:normal tone throughout; no atrophy noted Sensory: Pinprick and light touch intact throughout, bilaterally Deep Tendon Reflexes: 1+ in upper extremities, 1+ in patella and absent in the ankle Plantars: Right: downgoing   Left:  downgoing Cerebellar: normal finger-to-nose, normal rapid alternating movements and normal heel-to-shin test Gait: normal gait and station      Laboratory Studies:   Basic Metabolic Panel:  Recent Labs Lab 09/28/15 1347  NA 138  K 4.1  CL 101   CO2 28  GLUCOSE 171*  BUN 25*  CREATININE 1.15  CALCIUM 8.9    Liver Function Tests:  Recent Labs Lab 09/28/15 1347  AST 33  ALT 28  ALKPHOS 81  BILITOT 0.9  PROT 6.7  ALBUMIN 3.6   No results for input(s): LIPASE, AMYLASE in the last 168 hours. No results for input(s): AMMONIA in the last 168 hours.  CBC:  Recent Labs Lab 09/28/15 1347  WBC 6.2  NEUTROABS 4.1  HGB 12.8*  HCT 38.7*  MCV 88.4  PLT 159    Cardiac Enzymes: No results for input(s): CKTOTAL, CKMB, CKMBINDEX, TROPONINI in the last 168 hours.  BNP: Invalid input(s): POCBNP  CBG:  Recent Labs Lab 09/28/15 1606 09/28/15 2105 09/29/15 0746 09/29/15 0918  GLUCAP 247* 200* 161* 204*    Microbiology: No results found for this or any previous visit.  Coagulation Studies: No results for input(s): LABPROT, INR in the last 72 hours.  Urinalysis: No results for input(s): COLORURINE, LABSPEC, PHURINE, GLUCOSEU, HGBUR, BILIRUBINUR, KETONESUR, PROTEINUR, UROBILINOGEN, NITRITE, LEUKOCYTESUR in the last 168 hours.  Invalid input(s): APPERANCEUR  Lipid Panel:  No results found for: CHOL, TRIG, HDL, CHOLHDL, VLDL, LDLCALC  HgbA1C: No results found for: HGBA1C  Urine Drug Screen:  No results found for: LABOPIA, COCAINSCRNUR, LABBENZ, AMPHETMU, THCU, LABBARB  Alcohol Level: No results for input(s): ETH in the last 168 hours.  Other results: EKG: normal EKG, normal sinus rhythm, unchanged from previous tracings.  Imaging: No results found.   Assessment/Plan:  80 y.o. male male with a known history of Guillain barre syndrome, CAD, HTN, DM here with vision changes, weakness, gait abnormalities and weakness. He was also briefly disoriented per wife. He went to ED in [place] and had CT head. Was seen by Dr. Sherryll Burger in office yesterday and sent as direct admission for IVIG.  Pt states he ws treated at [facility] bout 4 yrs ago, at that time had b/l LE numbness which is similar to what he had at that time.      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  Pt does have LE numbness.   - will d/c IVIG as can cause increased hypercoagulable state in setting of history, CAD, HTN, DM - stroke work up that includes Pt/ot, 2dEcho, HbA1c, CTA h/n - [place] and VC q 8 hrs - if symptoms worsen and appear similar to GBS with assenting paralysis and worsening NIFF, VC would transfer pt for plasmapheresis instead of IVIG as I don't believe Plasmapheresis is offered at [facility], [place]    09/29/2015, 12:24 PM

## 2015-09-29 NOTE — Progress Notes (Signed)
°   09/29/15 1000  Clinical Encounter Type  Visited With Patient and family together  Visit Type Follow-up  Referral From Nurse  Consult/Referral To Chaplain  Spiritual Encounters  Spiritual Needs Other (Comment)  Stress Factors  Patient Stress Factors Health changes  Family Stress Factors Not reviewed  Completed AD documents - notarized and filed in chart. Chaplain Larose Kells Ext [phone]

## 2015-09-29 NOTE — Progress Notes (Signed)
Dr Amado Coe on rounds . Pt not taking topical agents but only prn. Orders chg to prn. Pt uses o2 2liters at home prn when sleeping and qhs. Orders chg to reflect above

## 2015-09-29 NOTE — Progress Notes (Signed)
NIF -30 best of 3, VC 60% best of 3.

## 2015-09-29 NOTE — NC FL2 (Signed)
[place] MEDICAID FL2 LEVEL OF CARE SCREENING TOOL     IDENTIFICATION  Patient Name: Colin Stokes Birthdate: 1935/09/29 Sex: male Admission Date (Current Location): 09/28/2015  [place] Number:  Recruitment consultant and Address:  [facility], [address], [place], [place] [address]      Provider Number: [id number]  Attending Physician Name and Address:  Ramonita Lab, MD  Relative Name and Phone Number:       Current Level of Care: Hospital Recommended Level of Care: Skilled Nursing Facility Prior Approval Number:    Date Approved/Denied:   PASRR Number:  ( [id number] )  Discharge Plan: SNF    Current Diagnoses: Patient Active Problem List   Diagnosis Date Noted  . CIDP (chronic inflammatory demyelinating polyneuropathy) (HCC) 09/28/2015    Orientation RESPIRATION BLADDER Height & Weight     Self, Time, Situation, Place  Normal Continent Weight: 243 lb 11.2 oz (110.542 kg) Height:   (180.3 cm)  BEHAVIORAL SYMPTOMS/MOOD NEUROLOGICAL BOWEL NUTRITION STATUS   (none )  (none ) Continent Diet (Diet: Heart Healthy/ Carb Modified )  AMBULATORY STATUS COMMUNICATION OF NEEDS Skin   Extensive Assist Verbally Normal                       Personal Care Assistance Level of Assistance  Bathing, Feeding, Dressing Bathing Assistance: Limited assistance Feeding assistance: Independent Dressing Assistance: Limited assistance     Functional Limitations Info  Sight, Hearing, Speech Sight Info: Adequate Hearing Info: Adequate Speech Info: Adequate    SPECIAL CARE FACTORS FREQUENCY  PT (By licensed PT)     PT Frequency:  (5)              Contractures      Additional Factors Info  Code Status, Allergies, Insulin Sliding Scale Code Status Info:  (Full Code. ) Allergies Info:  (Penicillins)   Insulin Sliding Scale Info:  (NovoLog Insulin Injections )       Current Medications (09/29/2015):  This is the  current hospital active medication list Current Facility-Administered Medications  Medication Dose Route Frequency Provider Last Rate Last Dose  . 0.9 %  sodium chloride infusion  250 mL Intravenous PRN Milagros Loll, MD      . acetaminophen (TYLENOL) tablet 650 mg  650 mg Oral Q6H PRN Milagros Loll, MD       Or  . acetaminophen (TYLENOL) suppository 650 mg  650 mg Rectal Q6H PRN Srikar Sudini, MD      . albuterol (PROVENTIL) (2.5 MG/3ML) 0.083% nebulizer solution 2.5 mg  2.5 mg Inhalation Q6H PRN Milagros Loll, MD      . aspirin EC tablet 81 mg  81 mg Oral Daily Milagros Loll, MD   81 mg at 09/29/15 0806  . atorvastatin (LIPITOR) tablet 20 mg  20 mg Oral Daily Milagros Loll, MD   20 mg at 09/29/15 0806  . bisacodyl (DULCOLAX) EC tablet 5 mg  5 mg Oral Daily PRN Srikar Sudini, MD      . citalopram (CELEXA) tablet 20 mg  20 mg Oral Daily Milagros Loll, MD   20 mg at 09/29/15 0806  . diclofenac sodium (VOLTAREN) 1 % transdermal gel 2 g  2 g Topical QID Milagros Loll, MD   2 g at 09/28/15 1740  . enoxaparin (LOVENOX) injection 40 mg  40 mg Subcutaneous Q24H Milagros Loll, MD   40 mg at 09/29/15 1308  . gabapentin (NEURONTIN) capsule 500  mg  500 mg Oral Daily Milagros Loll, MD   500 mg at 09/29/15 0808  . gabapentin (NEURONTIN) capsule 800 mg  800 mg Oral QHS Srikar Sudini, MD   800 mg at 09/28/15 2147  . hydrochlorothiazide (HYDRODIURIL) tablet 12.5 mg  12.5 mg Oral Daily Milagros Loll, MD   12.5 mg at 09/29/15 0806  . insulin aspart (novoLOG) injection 0-15 Units  0-15 Units Subcutaneous TID WC Milagros Loll, MD   3 Units at 09/29/15 1308  . insulin aspart (novoLOG) injection 15 Units  15 Units Subcutaneous TID WC Ramonita Lab, MD   15 Units at 09/29/15 1307  . insulin detemir (LEVEMIR) injection 36 Units  36 Units Subcutaneous BID Milagros Loll, MD   36 Units at 09/29/15 0929  . losartan (COZAAR) tablet 100 mg  100 mg Oral Daily Milagros Loll, MD   100 mg at 09/29/15 0805  . magnesium oxide  (MAG-OX) tablet 800 mg  800 mg Oral QPM Srikar Sudini, MD   800 mg at 09/28/15 1736  . metFORMIN (GLUCOPHAGE) tablet 1,000 mg  1,000 mg Oral BID WC Milagros Loll, MD   1,000 mg at 09/29/15 0807  . ondansetron (ZOFRAN) tablet 4 mg  4 mg Oral Q6H PRN Milagros Loll, MD       Or  . ondansetron (ZOFRAN) injection 4 mg  4 mg Intravenous Q6H PRN Srikar Sudini, MD      . pantoprazole (PROTONIX) EC tablet 40 mg  40 mg Oral Daily Milagros Loll, MD   40 mg at 09/29/15 0807  . polyethylene glycol (MIRALAX / GLYCOLAX) packet 17 g  17 g Oral Daily PRN Srikar Sudini, MD      . pramipexole (MIRAPEX) tablet 1 mg  1 mg Oral TID Milagros Loll, MD   1 mg at 09/29/15 0805  . sodium chloride flush (NS) 0.9 % injection 3 mL  3 mL Intravenous Q12H Srikar Sudini, MD   3 mL at 09/29/15 0930  . sodium chloride flush (NS) 0.9 % injection 3 mL  3 mL Intravenous PRN Srikar Sudini, MD      . traMADol (ULTRAM) tablet 50 mg  50 mg Oral Q6H PRN Srikar Sudini, MD      . triamcinolone cream (KENALOG) 0.1 % 1 application  1 application Topical BID Milagros Loll, MD   1 application at 09/28/15 2139  . zolpidem (AMBIEN) tablet 5 mg  5 mg Oral QHS PRN Milagros Loll, MD   5 mg at 09/28/15 2137     Discharge Medications: Please see discharge summary for a list of discharge medications.  Relevant Imaging Results:  Relevant Lab Results:   Additional Information  (SSN: [id number])  Haig Prophet, LCSW

## 2015-09-29 NOTE — Progress Notes (Signed)
PT Cancellation Note  Patient Details Name: DYLEN MCELHANNON MRN: [id number] DOB: [birth year]   Cancelled Treatment:    Reason Eval/Treat Not Completed: Other (comment). Evaluation attempted, however pt going out of room for MRI imaging. RN asked to come back at another time. Will re-attempt, time permitting.   Vertis Bauder 09/29/2015, 10:59 AM Elizabeth Palau, PT, DPT [phone]

## 2015-09-30 ENCOUNTER — Inpatient Hospital Stay
Admission: AD | Admit: 2015-09-30 | Discharge: 2015-09-30 | Disposition: A | Discharge status: Home / Self Care | Payer: Medicare Other | Source: Ambulatory Visit | Attending: Internal Medicine | Admitting: Internal Medicine

## 2015-09-30 DIAGNOSIS — I639 Cerebral infarction, unspecified: Secondary | ICD-10-CM

## 2015-09-30 LAB — GLUCOSE, CAPILLARY
GLUCOSE-CAPILLARY: 159 mg/dL — AB (ref 65–99)
Glucose-Capillary: 122 mg/dL — ABNORMAL HIGH (ref 65–99)

## 2015-09-30 LAB — LIPID PANEL
CHOLESTEROL: 166 mg/dL (ref 0–200)
HDL: 53 mg/dL (ref >40)
LDL Cholesterol: 71 mg/dL (ref 0–99)
TRIGLYCERIDES: 210 mg/dL — AB (ref <150)
Total CHOL/HDL Ratio: 3.1 RATIO
VLDL: 42 mg/dL — ABNORMAL HIGH (ref 0–40)

## 2015-09-30 LAB — HEMOGLOBIN A1C: Hgb A1c MFr Bld: 6.9 % — ABNORMAL HIGH (ref 4.0–6.0)

## 2015-09-30 MED ORDER — PERFLUTREN LIPID MICROSPHERE
1.0000 mL | INTRAVENOUS | Status: AC | PRN
  Filled 2015-09-30: qty 10

## 2015-09-30 MED ORDER — ATORVASTATIN CALCIUM 40 MG PO TABS: 40.0000 mg | ORAL_TABLET | Freq: Every day | ORAL | Status: AC

## 2015-09-30 MED ORDER — CLOPIDOGREL BISULFATE 75 MG PO TABS: 75.0000 mg | ORAL_TABLET | Freq: Every day | ORAL | Status: AC

## 2015-09-30 MED ORDER — GABAPENTIN 400 MG PO CAPS: 800.0000 mg | ORAL_CAPSULE | Freq: Every day | ORAL | Status: DC

## 2015-09-30 NOTE — Discharge Summary (Signed)
[facility] - [place] [facility], [facility]   PATIENT NAME: Colin Stokes    MR#:  [id number]  DATE OF BIRTH:  24-Oct-1935  DATE OF ADMISSION:  09/28/2015 ADMITTING PHYSICIAN: Adrian Saran, MD  DATE OF DISCHARGE: 09/30/2015 PRIMARY CARE PHYSICIAN: SPARKS,JEFFREY D, MD    ADMISSION DIAGNOSIS:  CIDP  IVIG  DISCHARGE DIAGNOSIS:  Acute CVA-with acute right PCA infarct  SECONDARY DIAGNOSIS:   Past Medical History  Diagnosis Date  . Asthma   . Diabetes mellitus without complication (HCC)   . Depression   . Headache   . Arthritis   . Myocardial infarction (HCC)   . Sleep apnea     HOSPITAL COURSE:  * Lower extremity weakness and blurry vision  from acute right PCA infarct  CT angiogram of the head and neck with acute right PCA infarct and no significant carotid artery stenosis Echocardiogram-technically difficult study with left ventricular ejection fraction 30-35% VIG is discontinued Patient is on aspirin and statin. Neurology Dr.Reynolds  has recommended to start the patient on Plavix PT consult -recommending outpatient PT Appreciate neurology recommendations, patient is to follow-up with his primary neurologist Dr. Sherryll Burger in a week Continue Lipitor   fasting lipid panel -LDL-71  hemoglobin A1c is done but the result is not going to be there because of the technical problems until late evening as reported by the lab. PCP to follow-up on the result Patient still has some blurry vision, though clinically improving, recommended not to drive until seen and cleared by his primary neurologist Dr. 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 Started patient on IVIG 2 mg/kg/day at the time of admission but it was discontinued as the patient has acute stroke, per neurology recommendations Monitored symptoms, not getting worse. Outpatient follow-up with primary neurologist in a week.  * DM SSI, ADA diet   * CAD Stable Continue  meds  * Asthma Stable  * DVT prophylaxis Lovenox DISCHARGE CONDITIONS:   fair  CONSULTS OBTAINED:  Treatment Team:  Thana Farr, MD Ramonita Lab, MD   PROCEDURES none  DRUG ALLERGIES:   Allergies  Allergen Reactions  . Penicillins Rash    DISCHARGE MEDICATIONS:   Current Discharge Medication List    START taking these medications   Details  clopidogrel (PLAVIX) 75 MG tablet Take 1 tablet (75 mg total) by mouth daily. Qty: 30 tablet, Refills: 0    !! gabapentin (NEURONTIN) 400 MG capsule Take 2 capsules (800 mg total) by mouth at bedtime. Qty: 60 capsule, Refills: 0     !! - Potential duplicate medications found. Please discuss with provider.    CONTINUE these medications which have CHANGED   Details  atorvastatin (LIPITOR) 40 MG tablet Take 1 tablet (40 mg total) by mouth daily. Qty: 60 tablet, Refills: 0      CONTINUE these medications which have NOT CHANGED   Details  albuterol (PROVENTIL HFA;VENTOLIN HFA) 108 (90 Base) MCG/ACT inhaler Inhale 1 puff into the lungs every 6 (six) hours as needed for wheezing or shortness of breath.    citalopram (CELEXA) 20 MG tablet Take 20 mg by mouth daily.    colchicine 0.6 MG tablet Take 0.6 mg by mouth as needed.    diclofenac sodium (VOLTAREN) 1 % GEL Apply topically 4 (four) times daily.    !! gabapentin (NEURONTIN) 100 MG capsule Take 500 mg by mouth daily.    hydrochlorothiazide (HYDRODIURIL) 12.5 MG tablet Take 12.5 mg by  mouth daily.    insulin detemir (LEVEMIR) 100 UNIT/ML injection Inject 36 Units into the skin 2 (two) times daily.    !! insulin regular (NOVOLIN R,HUMULIN R) 100 units/mL injection Inject 30 Units into the skin every morning.    !! insulin regular (NOVOLIN R,HUMULIN R) 100 units/mL injection Inject 34 Units into the skin at bedtime.    lidocaine-prilocaine (EMLA) cream Apply 1 application topically as needed.    losartan (COZAAR) 100 MG tablet Take 100 mg by mouth daily.     magnesium oxide (MAG-OX) 400 MG tablet Take 800 mg by mouth every evening.    metFORMIN (GLUCOPHAGE) 1000 MG tablet Take 1,000 mg by mouth 2 (two) times daily with a meal.    omeprazole (PRILOSEC) 20 MG capsule Take 20 mg by mouth daily.    pramipexole (MIRAPEX) 1 MG tablet Take 1 mg by mouth 3 (three) times daily.    traMADol (ULTRAM) 50 MG tablet Take 50 mg by mouth every 6 (six) hours as needed.    triamcinolone cream (KENALOG) 0.1 % Apply 1 application topically 2 (two) times daily.     !! - Potential duplicate medications found. Please discuss with provider.    STOP taking these medications     aspirin EC 81 MG tablet          DISCHARGE INSTRUCTIONS:   Activity as tolerated per outpatient PT recommendations Instructed not to drive until seen and cleared by his primary neurologist within week as he still complaining of some blurry vision Diet heart healthy and diabetic Follow-up with primary care physician in a week Follow-up with neurology Dr. Sherryll Burger within a week   DIET:  Diabetic diet  DISCHARGE CONDITION:  Fair  ACTIVITY:  Activity as tolerated, outpatient physical therapy Instructed not to drive until cleared by primary neurologist Dr. Sherryll Burger as he still complaining of some blurry vision  OXYGEN:  Home Oxygen: No.   Oxygen Delivery: room air  DISCHARGE LOCATION:  home   If you experience worsening of your admission symptoms, develop shortness of breath, life threatening emergency, suicidal or homicidal thoughts you must seek medical attention immediately by calling 911 or calling your MD immediately  if symptoms less severe.  You Must read complete instructions/literature along with all the possible adverse reactions/side effects for all the Medicines you take and that have been prescribed to you. Take any new Medicines after you have completely understood and accpet all the possible adverse reactions/side effects.   Please note  You were cared for by a  hospitalist during your hospital stay. If you have any questions about your discharge medications or the care you received while you were in the hospital after you are discharged, you can call the unit and asked to speak with the hospitalist on call if the hospitalist that took care of you is not available. Once you are discharged, your primary care physician will handle any further medical issues. Please note that NO REFILLS for any discharge medications will be authorized once you are discharged, as it is imperative that you return to your primary care physician (or establish a relationship with a primary care physician if you do not have one) for your aftercare needs so that they can reassess your need for medications and monitor your lab values.     Today  No chief complaint on file.  Patient is resting comfortably. Lower extremity tingling and numbness is stable and not getting worse. Denies any headache or dysarthria or dysphagia. Blurry vision  is better but still there  ROS:  CONSTITUTIONAL: Denies fevers, chills. Denies any fatigue, weakness.  EYES: Denies blurry vision, double vision, eye pain. EARS, NOSE, THROAT: Denies tinnitus, ear pain, hearing loss. RESPIRATORY: Denies cough, wheeze, shortness of breath.  CARDIOVASCULAR: Denies chest pain, palpitations, edema.  GASTROINTESTINAL: Denies nausea, vomiting, diarrhea, abdominal pain. Denies bright red blood per rectum. GENITOURINARY: Denies dysuria, hematuria. ENDOCRINE: Denies nocturia or thyroid problems. HEMATOLOGIC AND LYMPHATIC: Denies easy bruising or bleeding. SKIN: Denies rash or lesion. MUSCULOSKELETAL: Denies pain in neck, back, shoulder, knees, hips or arthritic symptoms.  NEUROLOGIC: Denies paralysis, reporting bilateral lower extremity tingling PSYCHIATRIC: Denies anxiety or depressive symptoms.   VITAL SIGNS:  Blood pressure 151/79, pulse 86, temperature 97.8 F (36.6 C), temperature source Oral, resp. rate 17,  height  (1.803 m), weight 110.542 kg (243 lb 11.2 oz), SpO2 91 %.  I/O:    Intake/Output Summary (Last 24 hours) at 09/30/15 1246 Last data filed at 09/29/15 1957  Gross per 24 hour  Intake    240 ml  Output    220 ml  Net     20 ml    PHYSICAL EXAMINATION:  GENERAL:  80 y.o.-year-old patient lying in the bed with no acute distress.  EYES: Pupils equal, round, reactive to light and accommodation. No scleral icterus. Extraocular muscles intact.  HEENT: Head atraumatic, normocephalic. Oropharynx and nasopharynx clear.  NECK:  Supple, no jugular venous distention. No thyroid enlargement, no tenderness.  LUNGS: Normal breath sounds bilaterally, no wheezing, rales,rhonchi or crepitation. No use of accessory muscles of respiration.  CARDIOVASCULAR: S1, S2 normal. No murmurs, rubs, or gallops.  ABDOMEN: Soft, non-tender, non-distended. Bowel sounds present. No organomegaly or mass.  EXTREMITIES: No pedal edema, cyanosis, or clubbing.  NEUROLOGIC: Cranial nerves II through XII are intact. Muscle strength 5/5 in all extremities. Bilateral lower extremity tingling, not getting worse, Gait not checked.  PSYCHIATRIC: The patient is alert and oriented x 3.  SKIN: No obvious rash, lesion, or ulcer.   DATA REVIEW:   CBC  Recent Labs Lab 09/28/15 1347  WBC 6.2  HGB 12.8*  HCT 38.7*  PLT 159    Chemistries   Recent Labs Lab 09/28/15 1347  NA 138  K 4.1  CL 101  CO2 28  GLUCOSE 171*  BUN 25*  CREATININE 1.15  CALCIUM 8.9  AST 33  ALT 28  ALKPHOS 81  BILITOT 0.9    Cardiac Enzymes No results for input(s): TROPONINI in the last 168 hours.  Microbiology Results  No results found for this or any previous visit.  RADIOLOGY:  Ct Angio Head W/cm &/or Wo Cm  09/29/2015  CLINICAL DATA:  80 year old male with acute right PCA infarct on MRI for weakness, visual changes, gait abnormality. Initial encounter. EXAM: CT ANGIOGRAPHY HEAD AND NECK TECHNIQUE: Multidetector CT  imaging of the head and neck was performed using the standard protocol during bolus administration of intravenous contrast. Multiplanar CT image reconstructions and MIPs were obtained to evaluate the vascular anatomy. Carotid stenosis measurements (when applicable) are obtained utilizing NASCET criteria, using the distal internal carotid diameter as the denominator. CONTRAST:  OMNIPAQUE IOHEXOL 350 MG/ML SOLN COMPARISON:  Brain MRI 1151 hours today. FINDINGS: CT HEAD Brain: Patchy hypodensity in the right PCA territory corresponding to areas of restricted diffusion on the earlier MRI. No associated hemorrhage or mass effect. No ventriculomegaly. Gray-white matter differentiation elsewhere is within normal limits. Calvarium and skull base:  No acute osseous abnormality identified. Paranasal sinuses:  Clear. Orbits: Visualized orbits and scalp soft tissues are within normal limits. CTA NECK Skeleton: Previous posterior cervical spine decompression from the C3 to the C7 level. Chronic disc, endplate, and facet hypertrophy. Osteopenia. No acute osseous abnormality identified. Other neck: Mild motion artifact in the upper chest. No superior mediastinal lymphadenopathy. Thyroid, larynx, pharynx, parapharyngeal spaces, retropharyngeal space (retropharyngeal carotids), sublingual space, submandibular glands, and parotid glands are within normal limits. No cervical lymphadenopathy. Mildly conspicuous but nonspecific left level 1B lymph nodes. Aortic arch: 4 vessel arch configuration, with the left vertebral artery arising directly from the arch. Mild for age calcified arch atherosclerosis. Right carotid system: No brachiocephalic or right CCA origin stenosis. Mild motion artifact at the thoracic inlet. Retropharyngeal course of the right CCA and right carotid bifurcation with minimal calcified plaque. Tortuous cervical right ICA, no stenosis in the neck. Left carotid system: Mild calcified plaque in the proximal left  CCA without stenosis. Partially retropharyngeal course. Widely patent left carotid bifurcation. Mildly dolichoectatic but otherwise negative cervical left ICA. Vertebral arteries: Calcified plaque in the proximal right subclavian artery without high-grade stenosis. Patent right vertebral artery origin with no stenosis evident. Otherwise negative cervical right vertebral artery. The left vertebral artery arises directly from the arch with no origin stenosis. It is tortuous in the V1 segment with a mildly late entry into the left cervical transverse foramen. No cervical left vertebral artery stenosis. CTA HEAD Posterior circulation: Fairly codominant distal vertebral arteries. Calcified plaque in both V4 segments, greater on the right. This is not hemodynamically significant. Normal PICA origins and vertebrobasilar junction. There is mild irregularity and stenosis in the proximal basilar artery. The basilar remains patent. The SCA and PCA origins are within normal limits. Posterior communicating arteries are diminutive or absent. The left posterior communicating artery is present, the right is diminutive or absent. There is mild to moderate irregularity in the left PCA P2 segment with preserved distal flow. There is severe irregularity involving a 7 mm segment of the right P2 with high-grade stenosis, but distal enhancement is preserved. Anterior circulation: Both ICA siphons are patent with calcified plaque distally greater on the left. This does not appear hemodynamically significant. Ophthalmic and left posterior communicating artery origins are normal. Carotid termini are patent. MCA and ACA origins are normal. Anterior communicating artery and visualized ACA branches are within normal limits. There is a median artery of the corpus callosum. Left MCA M1 and trifurcation are within normal limits aside from mild dolichoectasia. Left MCA branches are within normal limits. Right MCA M1 segment, bifurcation, and right  MCA branches are within normal limits. Venous sinuses: Patent. Anatomic variants: Left vertebral artery arises directly from the arch. Delayed phase: No abnormal enhancement identified. IMPRESSION: 1. Approximately 7 mm segment of severe irregularity and stenosis in the right PCA P2 segment corresponding to the acute right PCA infarct. 2. Upstream mild Distal vertebral artery and basilar atherosclerosis. There is mild to moderate irregularity and stenosis of the left PCA P2 segment. Note that the left vertebral artery arises directly from the aortic arch (normal variant). 3. Mild calcified plaque involving the carotid arteries and anterior circulation with no associated stenosis. 4. Expected CT appearance of the acute right PCA infarcts seen by MRI today. No associated hemorrhage or mass effect. 5. No acute findings in the neck. Electronically Signed   By: Odessa Fleming M.D.   On: 09/29/2015 15:52   Ct Angio Neck W/cm &/or Wo/cm  09/29/2015  CLINICAL DATA:  80 year old male with acute  right PCA infarct on MRI for weakness, visual changes, gait abnormality. Initial encounter. EXAM: CT ANGIOGRAPHY HEAD AND NECK TECHNIQUE: Multidetector CT imaging of the head and neck was performed using the standard protocol during bolus administration of intravenous contrast. Multiplanar CT image reconstructions and MIPs were obtained to evaluate the vascular anatomy. Carotid stenosis measurements (when applicable) are obtained utilizing NASCET criteria, using the distal internal carotid diameter as the denominator. CONTRAST:  OMNIPAQUE IOHEXOL 350 MG/ML SOLN COMPARISON:  Brain MRI 1151 hours today. FINDINGS: CT HEAD Brain: Patchy hypodensity in the right PCA territory corresponding to areas of restricted diffusion on the earlier MRI. No associated hemorrhage or mass effect. No ventriculomegaly. Gray-white matter differentiation elsewhere is within normal limits. Calvarium and skull base:  No acute osseous abnormality identified.  Paranasal sinuses: Clear. Orbits: Visualized orbits and scalp soft tissues are within normal limits. CTA NECK Skeleton: Previous posterior cervical spine decompression from the C3 to the C7 level. Chronic disc, endplate, and facet hypertrophy. Osteopenia. No acute osseous abnormality identified. Other neck: Mild motion artifact in the upper chest. No superior mediastinal lymphadenopathy. Thyroid, larynx, pharynx, parapharyngeal spaces, retropharyngeal space (retropharyngeal carotids), sublingual space, submandibular glands, and parotid glands are within normal limits. No cervical lymphadenopathy. Mildly conspicuous but nonspecific left level 1B lymph nodes. Aortic arch: 4 vessel arch configuration, with the left vertebral artery arising directly from the arch. Mild for age calcified arch atherosclerosis. Right carotid system: No brachiocephalic or right CCA origin stenosis. Mild motion artifact at the thoracic inlet. Retropharyngeal course of the right CCA and right carotid bifurcation with minimal calcified plaque. Tortuous cervical right ICA, no stenosis in the neck. Left carotid system: Mild calcified plaque in the proximal left CCA without stenosis. Partially retropharyngeal course. Widely patent left carotid bifurcation. Mildly dolichoectatic but otherwise negative cervical left ICA. Vertebral arteries: Calcified plaque in the proximal right subclavian artery without high-grade stenosis. Patent right vertebral artery origin with no stenosis evident. Otherwise negative cervical right vertebral artery. The left vertebral artery arises directly from the arch with no origin stenosis. It is tortuous in the V1 segment with a mildly late entry into the left cervical transverse foramen. No cervical left vertebral artery stenosis. CTA HEAD Posterior circulation: Fairly codominant distal vertebral arteries. Calcified plaque in both V4 segments, greater on the right. This is not hemodynamically significant. Normal PICA  origins and vertebrobasilar junction. There is mild irregularity and stenosis in the proximal basilar artery. The basilar remains patent. The SCA and PCA origins are within normal limits. Posterior communicating arteries are diminutive or absent. The left posterior communicating artery is present, the right is diminutive or absent. There is mild to moderate irregularity in the left PCA P2 segment with preserved distal flow. There is severe irregularity involving a 7 mm segment of the right P2 with high-grade stenosis, but distal enhancement is preserved. Anterior circulation: Both ICA siphons are patent with calcified plaque distally greater on the left. This does not appear hemodynamically significant. Ophthalmic and left posterior communicating artery origins are normal. Carotid termini are patent. MCA and ACA origins are normal. Anterior communicating artery and visualized ACA branches are within normal limits. There is a median artery of the corpus callosum. Left MCA M1 and trifurcation are within normal limits aside from mild dolichoectasia. Left MCA branches are within normal limits. Right MCA M1 segment, bifurcation, and right MCA branches are within normal limits. Venous sinuses: Patent. Anatomic variants: Left vertebral artery arises directly from the arch. Delayed phase: No abnormal enhancement  identified. IMPRESSION: 1. Approximately 7 mm segment of severe irregularity and stenosis in the right PCA P2 segment corresponding to the acute right PCA infarct. 2. Upstream mild Distal vertebral artery and basilar atherosclerosis. There is mild to moderate irregularity and stenosis of the left PCA P2 segment. Note that the left vertebral artery arises directly from the aortic arch (normal variant). 3. Mild calcified plaque involving the carotid arteries and anterior circulation with no associated stenosis. 4. Expected CT appearance of the acute right PCA infarcts seen by MRI today. No associated hemorrhage or  mass effect. 5. No acute findings in the neck. Electronically Signed   By: Odessa Fleming M.D.   On: 09/29/2015 15:52   Mr Laqueta Jean RU Contrast  09/29/2015  CLINICAL DATA:  Weakness, and gait abnormality, and visual changes. Brief disorientation. History of Guillain-Barre syndrome. EXAM: MRI HEAD WITHOUT AND WITH CONTRAST TECHNIQUE: Multiplanar, multiecho pulse sequences of the brain and surrounding structures were obtained without and with intravenous contrast. CONTRAST:  20mL MULTIHANCE GADOBENATE DIMEGLUMINE 529 MG/ML IV SOLN COMPARISON:  None. FINDINGS: There is right-sided posterior circulation restricted diffusion consistent with acute infarction involving the mesial right temporal lobe and right occipital lobe. There is associated cytotoxic edema without mass effect. There is no evidence of intracranial hemorrhage, mass, midline shift, or extra-axial fluid collection. There is mild generalized cerebral atrophy. Small foci of T2 hyperintensity scattered throughout the cerebral white matter bilaterally are nonspecific but compatible with mild chronic small vessel ischemic disease. No abnormal enhancement is identified. Orbits are unremarkable. The been no significant inflammatory disease is seen in the paranasal sinuses are mastoid air cells. Major intracranial vascular flow voids are grossly preserved, although there is suggestion of abnormal FLAIR hyperintensity in the right PCA which may reflect slow flow or occlusion related to the acute infarct. A prior incision is noted in the midline soft tissues of the upper neck posteriorly. IMPRESSION: Acute right PCA infarct involving the temporal and occipital lobes. Electronically Signed   By: Sebastian Ache M.D.   On: 09/29/2015 12:56    EKG:   Orders placed or performed in visit on 07/14/10  . EKG 12-Lead  . EKG 12-Lead  . EKG 12-Lead      Management plans discussed with the patient, family and they are in agreement.  CODE STATUS:     Code Status  Orders        Start     Ordered   09/28/15 1326  Full code   Continuous     09/28/15 1326    Code Status History    Date Active Date Inactive Code Status Order ID Comments User Context   This patient has a current code status but no historical code status.      TOTAL TIME TAKING CARE OF THIS PATIENT: 45  minutes.    @[facility] @  on 09/30/2015 at 12:46 PM  Between 7am to 6pm - Pager - [phone]  After 6pm go to [email or web address] - password [email or web address] [facility]  [facility]  Office  [phone]  CC: Primary care physician; Marguarite Arbour, MD

## 2015-09-30 NOTE — Progress Notes (Signed)
*  PRELIMINARY RESULTS* Echocardiogram 2D Echocardiogram has been performed.  Colin Stokes 09/30/2015, 10:05 AM

## 2015-09-30 NOTE — Discharge Instructions (Signed)
Activity as tolerated per outpatient PT recommendations Instructed not to drive until seen and cleared by his primary neurologist within week as he still complaining of some blurry vision Diet heart healthy and diabetic Follow-up with primary care physician in a week Follow-up with neurology Dr. Sherryll Burger within a week

## 2015-09-30 NOTE — Progress Notes (Signed)
Subjective: Patient with no progression in lower extremity numbness.  No breathing complaints.  Vision has improved but not at baseline.    Objective: Current vital signs: BP 151/79 mmHg  Pulse 86  Temp(Src) 97.8 F (36.6 C) (Oral)  Resp 17  Ht  (1.803 m)  Wt 110.542 kg (243 lb 11.2 oz)  BMI 34.00 kg/m2  SpO2 91% Vital signs in last 24 hours: Temp:  [97.8 F (36.6 C)-99 F (37.2 C)] 97.8 F (36.6 C) (02/16 0730) Pulse Rate:  [81-94] 86 (02/16 0730) Resp:  [17-19] 17 (02/16 0730) BP: (149-159)/(77-92) 151/79 mmHg (02/16 0730) SpO2:  [91 %-96 %] 91 % (02/16 0730)  Intake/Output from previous day: 02/15 0701 - 02/16 0700 In: 480 [P.O.:480] Out: 220 [Urine:220] Intake/Output this shift:   Nutritional status: Diet heart healthy/carb modified Room service appropriate?: Yes; Fluid consistency:: Thin  Neurologic Exam: Mental Status: Alert, oriented, thought content appropriate.  Speech fluent without evidence of aphasia.  Able to follow 3 step commands without difficulty. Cranial Nerves: II: Discs flat bilaterally; Visual fields grossly normal, pupils equal, round, reactive to light and accommodation III,IV, VI: ptosis not present, extra-ocular motions intact bilaterally V,VII: smile symmetric, facial light touch sensation normal bilaterally VIII: hearing normal bilaterally IX,X: gag reflex present XI: bilateral shoulder shrug XII: midline tongue extension Motor: Right : Upper extremity   5/5    Left:     Upper extremity   5/5  Lower extremity   5/5     Lower extremity   5/5 Tone and bulk:normal tone throughout; no atrophy noted Sensory: Pinprick and light touch decreased in the feet bilaterally Cerebellar: Finger to nose and heel to shin intact  Lab Results: Basic Metabolic Panel:  Recent Labs Lab 09/28/15 1347  NA 138  K 4.1  CL 101  CO2 28  GLUCOSE 171*  BUN 25*  CREATININE 1.15  CALCIUM 8.9    Liver Function Tests:  Recent Labs Lab 09/28/15 1347   AST 33  ALT 28  ALKPHOS 81  BILITOT 0.9  PROT 6.7  ALBUMIN 3.6   No results for input(s): LIPASE, AMYLASE in the last 168 hours. No results for input(s): AMMONIA in the last 168 hours.  CBC:  Recent Labs Lab 09/28/15 1347  WBC 6.2  NEUTROABS 4.1  HGB 12.8*  HCT 38.7*  MCV 88.4  PLT 159    Cardiac Enzymes: No results for input(s): CKTOTAL, CKMB, CKMBINDEX, TROPONINI in the last 168 hours.  Lipid Panel: No results for input(s): CHOL, TRIG, HDL, CHOLHDL, VLDL, LDLCALC in the last 168 hours.  CBG:  Recent Labs Lab 09/29/15 1302 09/29/15 1649 09/29/15 2053 09/30/15 0756 09/30/15 1109  GLUCAP 175* 153* 111* 122* 159*    Microbiology: No results found for this or any previous visit.  Coagulation Studies: No results for input(s): LABPROT, INR in the last 72 hours.  Imaging: Ct Angio Head W/cm &/or Wo Cm  09/29/2015  CLINICAL DATA:  80 year old male with acute right PCA infarct on MRI for weakness, visual changes, gait abnormality. Initial encounter. EXAM: CT ANGIOGRAPHY HEAD AND NECK TECHNIQUE: Multidetector CT imaging of the head and neck was performed using the standard protocol during bolus administration of intravenous contrast. Multiplanar CT image reconstructions and MIPs were obtained to evaluate the vascular anatomy. Carotid stenosis measurements (when applicable) are obtained utilizing NASCET criteria, using the distal internal carotid diameter as the denominator. CONTRAST:  OMNIPAQUE IOHEXOL 350 MG/ML SOLN COMPARISON:  Brain MRI 1151 hours today. FINDINGS: CT HEAD  Brain: Patchy hypodensity in the right PCA territory corresponding to areas of restricted diffusion on the earlier MRI. No associated hemorrhage or mass effect. No ventriculomegaly. Gray-white matter differentiation elsewhere is within normal limits. Calvarium and skull base:  No acute osseous abnormality identified. Paranasal sinuses: Clear. Orbits: Visualized orbits and scalp soft tissues are  within normal limits. CTA NECK Skeleton: Previous posterior cervical spine decompression from the C3 to the C7 level. Chronic disc, endplate, and facet hypertrophy. Osteopenia. No acute osseous abnormality identified. Other neck: Mild motion artifact in the upper chest. No superior mediastinal lymphadenopathy. Thyroid, larynx, pharynx, parapharyngeal spaces, retropharyngeal space (retropharyngeal carotids), sublingual space, submandibular glands, and parotid glands are within normal limits. No cervical lymphadenopathy. Mildly conspicuous but nonspecific left level 1B lymph nodes. Aortic arch: 4 vessel arch configuration, with the left vertebral artery arising directly from the arch. Mild for age calcified arch atherosclerosis. Right carotid system: No brachiocephalic or right CCA origin stenosis. Mild motion artifact at the thoracic inlet. Retropharyngeal course of the right CCA and right carotid bifurcation with minimal calcified plaque. Tortuous cervical right ICA, no stenosis in the neck. Left carotid system: Mild calcified plaque in the proximal left CCA without stenosis. Partially retropharyngeal course. Widely patent left carotid bifurcation. Mildly dolichoectatic but otherwise negative cervical left ICA. Vertebral arteries: Calcified plaque in the proximal right subclavian artery without high-grade stenosis. Patent right vertebral artery origin with no stenosis evident. Otherwise negative cervical right vertebral artery. The left vertebral artery arises directly from the arch with no origin stenosis. It is tortuous in the V1 segment with a mildly late entry into the left cervical transverse foramen. No cervical left vertebral artery stenosis. CTA HEAD Posterior circulation: Fairly codominant distal vertebral arteries. Calcified plaque in both V4 segments, greater on the right. This is not hemodynamically significant. Normal PICA origins and vertebrobasilar junction. There is mild irregularity and stenosis in  the proximal basilar artery. The basilar remains patent. The SCA and PCA origins are within normal limits. Posterior communicating arteries are diminutive or absent. The left posterior communicating artery is present, the right is diminutive or absent. There is mild to moderate irregularity in the left PCA P2 segment with preserved distal flow. There is severe irregularity involving a 7 mm segment of the right P2 with high-grade stenosis, but distal enhancement is preserved. Anterior circulation: Both ICA siphons are patent with calcified plaque distally greater on the left. This does not appear hemodynamically significant. Ophthalmic and left posterior communicating artery origins are normal. Carotid termini are patent. MCA and ACA origins are normal. Anterior communicating artery and visualized ACA branches are within normal limits. There is a median artery of the corpus callosum. Left MCA M1 and trifurcation are within normal limits aside from mild dolichoectasia. Left MCA branches are within normal limits. Right MCA M1 segment, bifurcation, and right MCA branches are within normal limits. Venous sinuses: Patent. Anatomic variants: Left vertebral artery arises directly from the arch. Delayed phase: No abnormal enhancement identified. IMPRESSION: 1. Approximately 7 mm segment of severe irregularity and stenosis in the right PCA P2 segment corresponding to the acute right PCA infarct. 2. Upstream mild Distal vertebral artery and basilar atherosclerosis. There is mild to moderate irregularity and stenosis of the left PCA P2 segment. Note that the left vertebral artery arises directly from the aortic arch (normal variant). 3. Mild calcified plaque involving the carotid arteries and anterior circulation with no associated stenosis. 4. Expected CT appearance of the acute right PCA infarcts seen by MRI  today. No associated hemorrhage or mass effect. 5. No acute findings in the neck. Electronically Signed   By: Odessa Fleming  M.D.   On: 09/29/2015 15:52   Ct Angio Neck W/cm &/or Wo/cm  09/29/2015  CLINICAL DATA:  80 year old male with acute right PCA infarct on MRI for weakness, visual changes, gait abnormality. Initial encounter. EXAM: CT ANGIOGRAPHY HEAD AND NECK TECHNIQUE: Multidetector CT imaging of the head and neck was performed using the standard protocol during bolus administration of intravenous contrast. Multiplanar CT image reconstructions and MIPs were obtained to evaluate the vascular anatomy. Carotid stenosis measurements (when applicable) are obtained utilizing NASCET criteria, using the distal internal carotid diameter as the denominator. CONTRAST:  OMNIPAQUE IOHEXOL 350 MG/ML SOLN COMPARISON:  Brain MRI 1151 hours today. FINDINGS: CT HEAD Brain: Patchy hypodensity in the right PCA territory corresponding to areas of restricted diffusion on the earlier MRI. No associated hemorrhage or mass effect. No ventriculomegaly. Gray-white matter differentiation elsewhere is within normal limits. Calvarium and skull base:  No acute osseous abnormality identified. Paranasal sinuses: Clear. Orbits: Visualized orbits and scalp soft tissues are within normal limits. CTA NECK Skeleton: Previous posterior cervical spine decompression from the C3 to the C7 level. Chronic disc, endplate, and facet hypertrophy. Osteopenia. No acute osseous abnormality identified. Other neck: Mild motion artifact in the upper chest. No superior mediastinal lymphadenopathy. Thyroid, larynx, pharynx, parapharyngeal spaces, retropharyngeal space (retropharyngeal carotids), sublingual space, submandibular glands, and parotid glands are within normal limits. No cervical lymphadenopathy. Mildly conspicuous but nonspecific left level 1B lymph nodes. Aortic arch: 4 vessel arch configuration, with the left vertebral artery arising directly from the arch. Mild for age calcified arch atherosclerosis. Right carotid system: No brachiocephalic or right CCA origin  stenosis. Mild motion artifact at the thoracic inlet. Retropharyngeal course of the right CCA and right carotid bifurcation with minimal calcified plaque. Tortuous cervical right ICA, no stenosis in the neck. Left carotid system: Mild calcified plaque in the proximal left CCA without stenosis. Partially retropharyngeal course. Widely patent left carotid bifurcation. Mildly dolichoectatic but otherwise negative cervical left ICA. Vertebral arteries: Calcified plaque in the proximal right subclavian artery without high-grade stenosis. Patent right vertebral artery origin with no stenosis evident. Otherwise negative cervical right vertebral artery. The left vertebral artery arises directly from the arch with no origin stenosis. It is tortuous in the V1 segment with a mildly late entry into the left cervical transverse foramen. No cervical left vertebral artery stenosis. CTA HEAD Posterior circulation: Fairly codominant distal vertebral arteries. Calcified plaque in both V4 segments, greater on the right. This is not hemodynamically significant. Normal PICA origins and vertebrobasilar junction. There is mild irregularity and stenosis in the proximal basilar artery. The basilar remains patent. The SCA and PCA origins are within normal limits. Posterior communicating arteries are diminutive or absent. The left posterior communicating artery is present, the right is diminutive or absent. There is mild to moderate irregularity in the left PCA P2 segment with preserved distal flow. There is severe irregularity involving a 7 mm segment of the right P2 with high-grade stenosis, but distal enhancement is preserved. Anterior circulation: Both ICA siphons are patent with calcified plaque distally greater on the left. This does not appear hemodynamically significant. Ophthalmic and left posterior communicating artery origins are normal. Carotid termini are patent. MCA and ACA origins are normal. Anterior communicating artery and  visualized ACA branches are within normal limits. There is a median artery of the corpus callosum. Left MCA M1 and  trifurcation are within normal limits aside from mild dolichoectasia. Left MCA branches are within normal limits. Right MCA M1 segment, bifurcation, and right MCA branches are within normal limits. Venous sinuses: Patent. Anatomic variants: Left vertebral artery arises directly from the arch. Delayed phase: No abnormal enhancement identified. IMPRESSION: 1. Approximately 7 mm segment of severe irregularity and stenosis in the right PCA P2 segment corresponding to the acute right PCA infarct. 2. Upstream mild Distal vertebral artery and basilar atherosclerosis. There is mild to moderate irregularity and stenosis of the left PCA P2 segment. Note that the left vertebral artery arises directly from the aortic arch (normal variant). 3. Mild calcified plaque involving the carotid arteries and anterior circulation with no associated stenosis. 4. Expected CT appearance of the acute right PCA infarcts seen by MRI today. No associated hemorrhage or mass effect. 5. No acute findings in the neck. Electronically Signed   By: Odessa Fleming M.D.   On: 09/29/2015 15:52   Mr Laqueta Jean ZO Contrast  09/29/2015  CLINICAL DATA:  Weakness, and gait abnormality, and visual changes. Brief disorientation. History of Guillain-Barre syndrome. EXAM: MRI HEAD WITHOUT AND WITH CONTRAST TECHNIQUE: Multiplanar, multiecho pulse sequences of the brain and surrounding structures were obtained without and with intravenous contrast. CONTRAST:  20mL MULTIHANCE GADOBENATE DIMEGLUMINE 529 MG/ML IV SOLN COMPARISON:  None. FINDINGS: There is right-sided posterior circulation restricted diffusion consistent with acute infarction involving the mesial right temporal lobe and right occipital lobe. There is associated cytotoxic edema without mass effect. There is no evidence of intracranial hemorrhage, mass, midline shift, or extra-axial fluid  collection. There is mild generalized cerebral atrophy. Small foci of T2 hyperintensity scattered throughout the cerebral white matter bilaterally are nonspecific but compatible with mild chronic small vessel ischemic disease. No abnormal enhancement is identified. Orbits are unremarkable. The been no significant inflammatory disease is seen in the paranasal sinuses are mastoid air cells. Major intracranial vascular flow voids are grossly preserved, although there is suggestion of abnormal FLAIR hyperintensity in the right PCA which may reflect slow flow or occlusion related to the acute infarct. A prior incision is noted in the midline soft tissues of the upper neck posteriorly. IMPRESSION: Acute right PCA infarct involving the temporal and occipital lobes. Electronically Signed   By: Sebastian Ache M.D.   On: 09/29/2015 12:56    Medications:  I have reviewed the patient's current medications. Prior to Admission:  Prescriptions prior to admission  Medication Sig Dispense Refill Last Dose  . albuterol (PROVENTIL HFA;VENTOLIN HFA) 108 (90 Base) MCG/ACT inhaler Inhale 1 puff into the lungs every 6 (six) hours as needed for wheezing or shortness of breath.     Marland Kitchen aspirin EC 81 MG tablet Take 81 mg by mouth daily.     Marland Kitchen atorvastatin (LIPITOR) 20 MG tablet Take 20 mg by mouth daily.     . citalopram (CELEXA) 20 MG tablet Take 20 mg by mouth daily.     . colchicine 0.6 MG tablet Take 0.6 mg by mouth as needed.     . diclofenac sodium (VOLTAREN) 1 % GEL Apply topically 4 (four) times daily.     Marland Kitchen gabapentin (NEURONTIN) 100 MG capsule Take 500 mg by mouth daily.     . hydrochlorothiazide (HYDRODIURIL) 12.5 MG tablet Take 12.5 mg by mouth daily.     . insulin detemir (LEVEMIR) 100 UNIT/ML injection Inject 36 Units into the skin 2 (two) times daily.     . insulin regular (NOVOLIN R,HUMULIN R) 100  units/mL injection Inject 30 Units into the skin every morning.     . insulin regular (NOVOLIN R,HUMULIN R) 100  units/mL injection Inject 34 Units into the skin at bedtime.     . lidocaine-prilocaine (EMLA) cream Apply 1 application topically as needed.     Marland Kitchen losartan (COZAAR) 100 MG tablet Take 100 mg by mouth daily.     . magnesium oxide (MAG-OX) 400 MG tablet Take 800 mg by mouth every evening.     . metFORMIN (GLUCOPHAGE) 1000 MG tablet Take 1,000 mg by mouth 2 (two) times daily with a meal.     . omeprazole (PRILOSEC) 20 MG capsule Take 20 mg by mouth daily.     . pramipexole (MIRAPEX) 1 MG tablet Take 1 mg by mouth 3 (three) times daily.     . traMADol (ULTRAM) 50 MG tablet Take 50 mg by mouth every 6 (six) hours as needed.     . triamcinolone cream (KENALOG) 0.1 % Apply 1 application topically 2 (two) times daily.       Assessment/Plan: Patient with no progression of symptoms today.  MRI of the brain personally reviewed and shows acute PCA infarct.  CTA findings are consistent.  NIF and VC are normal.  No respiratory complaints.    Recommendations: 1.  Echocardiogram, A1c and lipid panel pending 2.  Plavix 75mg  daily.  D/C ASA 3.  Patient to follow up with Dr. Sherryll Burger at discharge.   LOS: 2 days   Thana Farr, MD Neurology [phone] 09/30/2015  11:59 AM

## 2015-09-30 NOTE — Care Management Important Message (Signed)
Important Message  Patient Details  Name: Colin Stokes MRN: [id number] Date of Birth: 17-Feb-1936   Medicare Important Message Given:  Yes    [name] A Nicklas Mcsweeney 09/30/2015, 11:56 AM

## 2015-09-30 NOTE — Progress Notes (Signed)
PT DISCHARGED HOME WITH NEW PLAVIX CHG DOSE OF LIPITOR AND GABAPENTIN. PT I/S TO NOT DRIVE AT ALL . VISION IS STILL BLURRED

## 2015-09-30 NOTE — Progress Notes (Signed)
Physical Therapy Treatment Patient Details Name: Colin Stokes MRN: [id number] DOB: Feb 18, 1936 Today's Date: 09/30/2015    History of Present Illness Pt admitted for complaints of weakness with vison/gait deficits. Pt with history of Guillian Barre and CIDP. Pt now diagnosed with positive CVA in R PCA. Pt with history of CAD, HTN, and DM.     PT Comments    Session limited this date as pt currently being discharged from hospital. Pt is making good progress towards goals with increased ambulation distance this date. Improved balance noted with shoes donned, however pt still demonstrates unsteady gait and reaches out for falls. No formal LOB noted. Pt has SPC and rw available in home. Still recommend OP PT to address balance concerns.  Follow Up Recommendations  Outpatient PT     Equipment Recommendations  None recommended by PT    Recommendations for Other Services       Precautions / Restrictions Precautions Precautions: Fall Restrictions Weight Bearing Restrictions: No    Mobility  Bed Mobility Overal bed mobility: Independent             General bed mobility comments: safe technique performed  Transfers Overall transfer level: Independent Equipment used: None             General transfer comment: safe technique performed without use of AD.  Ambulation/Gait Ambulation/Gait assistance: Min guard Ambulation Distance (Feet): 220 Feet Assistive device: None Gait Pattern/deviations: Step-through pattern     General Gait Details: Pt ambulated with wide BOS around RN station. Pt shows improved technique with decreased number of attempts for reaching towards the wall. Shoes donned prior to ambulation that improve balance. Pt also cued to pay attention to task in order to improve balance. Improved gait speed noted this date.   Stairs            Wheelchair Mobility    Modified Rankin (Stroke Patients Only)       Balance                                     Cognition Arousal/Alertness: Awake/alert Behavior During Therapy: WFL for tasks assessed/performed Overall Cognitive Status: Within Functional Limits for tasks assessed                      Exercises Other Exercises Other Exercises: exercises deferred as pt getting ready to leave hospital    General Comments        Pertinent Vitals/Pain Pain Assessment: No/denies pain    Home Living                      Prior Function            PT Goals (current goals can now be found in the care plan section) Acute Rehab PT Goals Patient Stated Goal: to play golf PT Goal Formulation: With patient Time For Goal Achievement: 10/13/15 Potential to Achieve Goals: Good Progress towards PT goals: Progressing toward goals    Frequency  7X/week    PT Plan Current plan remains appropriate    Co-evaluation             End of Session Equipment Utilized During Treatment: Gait belt Activity Tolerance: Patient tolerated treatment well Patient left: in bed     Time: 1451-1506 PT Time Calculation (min) (ACUTE ONLY): 15 min  Charges:  $Gait Training: 8-22 mins  G Codes:      Colin Stokes 09/30/2015, 5:04 PM Colin Stokes, PT, DPT [phone]

## 2015-10-03 ENCOUNTER — Encounter: Payer: Self-pay | Admitting: Emergency Medicine

## 2015-10-03 ENCOUNTER — Emergency Department: Payer: Medicare Other

## 2015-10-03 ENCOUNTER — Emergency Department
Admission: EM | Admit: 2015-10-03 | Discharge: 2015-10-03 | Disposition: A | Discharge status: Home / Self Care | Payer: Medicare Other | Attending: Emergency Medicine | Admitting: Emergency Medicine

## 2015-10-03 DIAGNOSIS — Z87891 Personal history of nicotine dependence: Secondary | ICD-10-CM | POA: Insufficient documentation

## 2015-10-03 DIAGNOSIS — Z7902 Long term (current) use of antithrombotics/antiplatelets: Secondary | ICD-10-CM | POA: Insufficient documentation

## 2015-10-03 DIAGNOSIS — Z79899 Other long term (current) drug therapy: Secondary | ICD-10-CM | POA: Diagnosis not present

## 2015-10-03 DIAGNOSIS — H538 Other visual disturbances: Secondary | ICD-10-CM | POA: Insufficient documentation

## 2015-10-03 DIAGNOSIS — Z880 Allergy status to penicillin: Secondary | ICD-10-CM | POA: Insufficient documentation

## 2015-10-03 DIAGNOSIS — Z7984 Long term (current) use of oral hypoglycemic drugs: Secondary | ICD-10-CM | POA: Insufficient documentation

## 2015-10-03 DIAGNOSIS — R51 Headache: Secondary | ICD-10-CM | POA: Diagnosis present

## 2015-10-03 DIAGNOSIS — R519 Headache, unspecified: Secondary | ICD-10-CM

## 2015-10-03 DIAGNOSIS — Z794 Long term (current) use of insulin: Secondary | ICD-10-CM | POA: Insufficient documentation

## 2015-10-03 DIAGNOSIS — E119 Type 2 diabetes mellitus without complications: Secondary | ICD-10-CM | POA: Insufficient documentation

## 2015-10-03 DIAGNOSIS — Z791 Long term (current) use of non-steroidal anti-inflammatories (NSAID): Secondary | ICD-10-CM | POA: Diagnosis not present

## 2015-10-03 DIAGNOSIS — R202 Paresthesia of skin: Secondary | ICD-10-CM | POA: Diagnosis not present

## 2015-10-03 NOTE — ED Notes (Signed)
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  took tylenol, gradually worsened, blurry vision.  Was here friday and was told he had some strokes in the past that he wasnt aware of after having an MRI.  Called EMS, headache persists, no other symptoms with ems.  Blurry vision resolved.  Vital signs withiin limits, 130/70 BP.  spo2 93% room air.  hx of gullian barre.  No drift, droop, or slurry speech noted.

## 2015-10-03 NOTE — ED Notes (Signed)
Patient transported to CT 

## 2015-10-03 NOTE — Discharge Instructions (Signed)
General Headache Without Cause °A headache is pain or discomfort felt around the head or neck area. The specific cause of a headache may not be found. There are many causes and types of headaches. A few common ones are: °· Tension headaches. °· Migraine headaches. °· Cluster headaches. °· Chronic daily headaches. °HOME CARE INSTRUCTIONS  °Watch your condition for any changes. Take these steps to help with your condition: °Managing Pain °· Take over-the-counter and prescription medicines only as told by your health care provider. °· Lie down in a dark, quiet room when you have a headache. °· If directed, apply ice to the head and neck area: °· Put ice in a plastic bag. °· Place a towel between your skin and the bag. °· Leave the ice on for 20 minutes, 2-3 times per day. °· Use a heating pad or hot shower to apply heat to the head and neck area as told by your health care provider. °· Keep lights dim if bright lights bother you or make your headaches worse. °Eating and Drinking °· Eat meals on a regular schedule. °· Limit alcohol use. °· Decrease the amount of caffeine you drink, or stop drinking caffeine. °General Instructions °· Keep all follow-up visits as told by your health care provider. This is important. °· Keep a headache journal to help find out what may trigger your headaches. For example, write down: °· What you eat and drink. °· How much sleep you get. °· Any change to your diet or medicines. °· Try massage or other relaxation techniques. °· Limit stress. °· Sit up straight, and do not tense your muscles. °· Do not use tobacco products, including cigarettes, chewing tobacco, or e-cigarettes. If you need help quitting, ask your health care provider. °· Exercise regularly as told by your health care provider. °· Sleep on a regular schedule. Get 7-9 hours of sleep, or the amount recommended by your health care provider. °SEEK MEDICAL CARE IF:  °· Your symptoms are not helped by medicine. °· You have a  headache that is different from the usual headache. °· You have nausea or you vomit. °· You have a fever. °SEEK IMMEDIATE MEDICAL CARE IF:  °· Your headache becomes severe. °· You have repeated vomiting. °· You have a stiff neck. °· You have a loss of vision. °· You have problems with speech. °· You have pain in the eye or ear. °· You have muscular weakness or loss of muscle control. °· You lose your balance or have trouble walking. °· You feel faint or pass out. °· You have confusion. °  °This information is not intended to replace advice given to you by your health care provider. Make sure you discuss any questions you have with your health care provider. °  °Document Released: 07/31/2005 Document Revised: 04/21/2015 Document Reviewed: 11/23/2014 °Elsevier Interactive Patient Education ©2016 [employer] ° °Paresthesia °Paresthesia is an abnormal burning or prickling sensation. This sensation is generally felt in the hands, arms, legs, or feet. However, it may occur in any part of the body. Usually, it is not painful. The feeling may be described as: °· Tingling or numbness. °· Pins and needles. °· Skin crawling. °· Buzzing. °· Limbs falling asleep. °· Itching. °Most people experience temporary (transient) paresthesia at some time in their lives. Paresthesia may occur when you breathe too quickly (hyperventilation). It can also occur without any apparent cause. Commonly, paresthesia occurs when pressure is placed on a nerve. The sensation quickly goes away after the   pressure is removed. For some people, however, paresthesia is a long-lasting (chronic) condition that is caused by an underlying disorder. If you continue to have paresthesia, you may need further medical evaluation. °HOME CARE INSTRUCTIONS °Watch your condition for any changes. Taking the following actions may help to lessen any discomfort that you are feeling: °· Avoid drinking alcohol. °· Try acupuncture or massage to help relieve your  symptoms. °· Keep all follow-up visits as directed by your health care provider. This is important. °SEEK MEDICAL CARE IF: °· You continue to have episodes of paresthesia. °· Your burning or prickling feeling gets worse when you walk. °· You have pain, cramps, or dizziness. °· You develop a rash. °SEEK IMMEDIATE MEDICAL CARE IF: °· You feel weak. °· You have trouble walking or moving. °· You have problems with speech, understanding, or vision. °· You feel confused. °· You cannot control your bladder or bowel movements. °· You have numbness after an injury. °· You faint. °  °This information is not intended to replace advice given to you by your health care provider. Make sure you discuss any questions you have with your health care provider. °  °Document Released: 07/21/2002 Document Revised: 12/15/2014 Document Reviewed: 07/27/2014 °Elsevier Interactive Patient Education ©2016 [employer] ° °

## 2015-10-03 NOTE — ED Provider Notes (Signed)
[facility] Emergency Department Provider Note  ____________________________________________  Time seen: 3:20 PM  I have reviewed the triage vital signs and the nursing notes.   HISTORY  Chief Complaint Headache    HPI Colin Stokes is a 80 y.o. male who comes to the ED today due to headache. 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 He seemed to be very uncomfortable but is eating lunch with him. He also seemed to be having some visual disturbance and seem distracted. Patient reports the headache was on both sides but primarily the right parietal area, aching, nonradiating. No aggravating or alleviating factors.  The patient was recently hospitalized for lower extremity weakness and found to have an acute stroke on MRI. He also has a history of Guillain-Barr. He has been improving and has been compliant with medical therapy including statin and aspirin Plavix antihypertensives and insulin. He also reports having some paresthesias in his left arm that started the hand and work their way upward.  On his way to the hospital, his symptoms all resolved. He now feels back to normal.She reports that he has chronic visual disturbances for the past several months. He seen his eye doctor and then referred to neuro ophthalmology. He also has a follow-up appointment with his ophthalmologist Dr. Clelia Croft in 2 days. He also sees Dr. Darrold Junker and Dr. Judithann Sheen.     Past Medical History  Diagnosis Date  . Asthma   . Diabetes mellitus without complication (HCC)   . Depression   . Headache   . Arthritis   . Myocardial infarction (HCC)   . Sleep apnea      Patient Active Problem List   Diagnosis Date Noted  . CIDP (chronic inflammatory demyelinating polyneuropathy) (HCC) 09/28/2015     Past Surgical History  Procedure Laterality Date  . Appendectomy       Current Outpatient Rx   Name  Route  Sig  Dispense  Refill  . albuterol (PROVENTIL HFA;VENTOLIN HFA) 108 (90 Base) MCG/ACT inhaler   Inhalation   Inhale 1 puff into the lungs every 6 (six) hours as needed for wheezing or shortness of breath.         Marland Kitchen atorvastatin (LIPITOR) 40 MG tablet   Oral   Take 1 tablet (40 mg total) by mouth daily.   60 tablet   0   . citalopram (CELEXA) 20 MG tablet   Oral   Take 20 mg by mouth daily.         . clopidogrel (PLAVIX) 75 MG tablet   Oral   Take 1 tablet (75 mg total) by mouth daily.   30 tablet   0   . colchicine 0.6 MG tablet   Oral   Take 0.6 mg by mouth as needed.         . diclofenac sodium (VOLTAREN) 1 % GEL   Topical   Apply topically 4 (four) times daily.         Marland Kitchen gabapentin (NEURONTIN) 100 MG capsule   Oral   Take 500 mg by mouth daily.         Marland Kitchen gabapentin (NEURONTIN) 400 MG capsule   Oral   Take 2 capsules (800 mg total) by mouth at bedtime.   60 capsule   0   . hydrochlorothiazide (HYDRODIURIL) 12.5 MG tablet   Oral   Take 12.5 mg by mouth daily.         Marland Kitchen  insulin detemir (LEVEMIR) 100 UNIT/ML injection   Subcutaneous   Inject 36 Units into the skin 2 (two) times daily.         . insulin regular (NOVOLIN R,HUMULIN R) 100 units/mL injection   Subcutaneous   Inject 30 Units into the skin every morning.         . insulin regular (NOVOLIN R,HUMULIN R) 100 units/mL injection   Subcutaneous   Inject 34 Units into the skin at bedtime.         . lidocaine-prilocaine (EMLA) cream   Topical   Apply 1 application topically as needed.         Marland Kitchen losartan (COZAAR) 100 MG tablet   Oral   Take 100 mg by mouth daily.         . magnesium oxide (MAG-OX) 400 MG tablet   Oral   Take 800 mg by mouth every evening.         . metFORMIN (GLUCOPHAGE) 1000 MG tablet   Oral   Take 1,000 mg by mouth 2 (two) times daily with a meal.         . omeprazole (PRILOSEC) 20 MG capsule   Oral   Take 20 mg by mouth daily.          . pramipexole (MIRAPEX) 1 MG tablet   Oral   Take 1 mg by mouth 3 (three) times daily.         . traMADol (ULTRAM) 50 MG tablet   Oral   Take 50 mg by mouth every 6 (six) hours as needed.         . triamcinolone cream (KENALOG) 0.1 %   Topical   Apply 1 application topically 2 (two) times daily.            Allergies Penicillins   Family History  Problem Relation Age of Onset  . CAD      Social History Social History  Substance Use Topics  . Smoking status: Former Games developer  . Smokeless tobacco: None  . Alcohol Use: 0.0 oz/week    0 Standard drinks or equivalent per week     Comment: occasionally    Review of Systems  Constitutional:   No fever or chills. No weight changes Eyes:   No blurry vision or double vision.  ENT:   No sore throat.  Cardiovascular:   No chest pain. Respiratory:   No dyspnea or cough. Gastrointestinal:   Negative for abdominal pain, vomiting and diarrhea.  No BRBPR or melena. Genitourinary:   Negative for dysuria or difficulty urinating. Musculoskeletal:   Negative for back pain. No joint swelling or pain. Skin:   Negative for rash. Neurological:  Positive headache as above with paresthesias of the left arm. No weakness. No falls. No trauma. Psychiatric:  No anxiety or depression.   Endocrine:  No changes in energy or sleep difficulty.  10-point ROS otherwise negative.  ____________________________________________   PHYSICAL EXAM:  VITAL SIGNS: ED Triage Vitals  Enc Vitals Group     BP 10/03/15 1501 122/76 mmHg     Pulse Rate 10/03/15 1501 87     Resp 10/03/15 1501 19     Temp 10/03/15 1501 98.3 F (36.8 C)     Temp Source 10/03/15 1501 Oral     SpO2 10/03/15 1458 92 %     Weight 10/03/15 1501 234 lb (106.142 kg)     Height 10/03/15 1501  (1.803 m)     Head Cir --  Peak Flow --      Pain Score 10/03/15 1502 0     Pain Loc --      Pain Edu? --      Excl. in GC? --     Vital signs reviewed, nursing  assessments reviewed.   Constitutional:   Alert and oriented. Well appearing and in no distress. Eyes:   No scleral icterus. No conjunctival pallor. PERRL. EOMI ENT   Head:   Normocephalic and atraumatic.   Nose:   No congestion/rhinnorhea. No septal hematoma   Mouth/Throat:   MMM, no pharyngeal erythema. No peritonsillar mass.    Neck:   No stridor. No SubQ emphysema. No meningismus. Hematological/Lymphatic/Immunilogical:   No cervical lymphadenopathy. Cardiovascular:   RRR. Symmetric bilateral radial and DP pulses.  No murmurs.  Respiratory:   Normal respiratory effort without tachypnea nor retractions. Breath sounds are clear and equal bilaterally. No wheezes/rales/rhonchi. Gastrointestinal:   Soft and nontender. Non distended. There is no CVA tenderness.  No rebound, rigidity, or guarding. Genitourinary:   deferred Musculoskeletal:   Nontender with normal range of motion in all extremities. No joint effusions.  No lower extremity tenderness.  No edema. Neurologic:   Normal speech and language.  CN 2-10 normal. Motor grossly intact. No pronator drift. Normal finger to nose. Normal gait No gross focal neurologic deficits are appreciated.  Skin:    Skin is warm, dry and intact. No rash noted.  No petechiae, purpura, or bullae. Psychiatric:   Mood and affect are normal. ____________________________________________    LABS (pertinent positives/negatives) (all labs ordered are listed, but only abnormal results are displayed) Labs Reviewed - No data to display ____________________________________________   EKG    ____________________________________________    RADIOLOGY  CT head unremarkable  ____________________________________________   PROCEDURES   ____________________________________________   INITIAL IMPRESSION / ASSESSMENT AND PLAN / ED COURSE  Pertinent labs & imaging results that were available during my care of the patient were reviewed by me  and considered in my medical decision making (see chart for details).  Patient presents with headache and paresthesia of the left arm with a which is now resolved. He is compliant on medical therapy for strokes. Symptoms have resolved and this may be a TIA or another unspecified headache. Given that he is artery had an extensive workup just a few days ago and is on optimal medical therapy, did not see any benefit to hospitalization at this point. We'll have him follow-up with his neurologist Dr. Sherryll Burger at his appointment in 2 days.     ____________________________________________   FINAL CLINICAL IMPRESSION(S) / ED DIAGNOSES  Final diagnoses:  Acute nonintractable headache, unspecified headache type  Paresthesia of arm      Sharman Cheek, MD 10/03/15 1654

## 2015-10-14 NOTE — ED Provider Notes (Signed)
Late entry note for EKG interpretation EKG performed 10/03/2015. I reviewed this EKG, interpreted, and considered information in my evaluation of the patient.  Sinus rhythm rate of 89, left axis, first-degree AV block with PR interval 240 ms. Poor R-wave progression in anterior precordial leads. Inferior Q waves. Normal ST segments and T waves. No acute ischemic changes.  Sharman Cheek, MD 10/14/15 [phone]

## 2016-02-18 ENCOUNTER — Ambulatory Visit: Payer: Medicare Other

## 2016-02-18 ENCOUNTER — Ambulatory Visit
Admission: EM | Admit: 2016-02-18 | Discharge: 2016-02-18 | Disposition: A | Discharge status: Home / Self Care | Payer: Medicare Other | Attending: Family Medicine | Admitting: Family Medicine

## 2016-02-18 DIAGNOSIS — R0781 Pleurodynia: Secondary | ICD-10-CM | POA: Diagnosis present

## 2016-02-18 DIAGNOSIS — F329 Major depressive disorder, single episode, unspecified: Secondary | ICD-10-CM | POA: Insufficient documentation

## 2016-02-18 DIAGNOSIS — Z87891 Personal history of nicotine dependence: Secondary | ICD-10-CM | POA: Diagnosis not present

## 2016-02-18 DIAGNOSIS — S2231XA Fracture of one rib, right side, initial encounter for closed fracture: Secondary | ICD-10-CM | POA: Insufficient documentation

## 2016-02-18 DIAGNOSIS — Z79899 Other long term (current) drug therapy: Secondary | ICD-10-CM | POA: Diagnosis not present

## 2016-02-18 DIAGNOSIS — Z794 Long term (current) use of insulin: Secondary | ICD-10-CM | POA: Insufficient documentation

## 2016-02-18 DIAGNOSIS — J45909 Unspecified asthma, uncomplicated: Secondary | ICD-10-CM | POA: Insufficient documentation

## 2016-02-18 DIAGNOSIS — I252 Old myocardial infarction: Secondary | ICD-10-CM | POA: Insufficient documentation

## 2016-02-18 DIAGNOSIS — Z880 Allergy status to penicillin: Secondary | ICD-10-CM | POA: Insufficient documentation

## 2016-02-18 DIAGNOSIS — W19XXXA Unspecified fall, initial encounter: Secondary | ICD-10-CM | POA: Insufficient documentation

## 2016-02-18 DIAGNOSIS — Z7982 Long term (current) use of aspirin: Secondary | ICD-10-CM | POA: Insufficient documentation

## 2016-02-18 DIAGNOSIS — E119 Type 2 diabetes mellitus without complications: Secondary | ICD-10-CM | POA: Insufficient documentation

## 2016-02-18 DIAGNOSIS — G4730 Sleep apnea, unspecified: Secondary | ICD-10-CM | POA: Insufficient documentation

## 2016-02-18 HISTORY — DX: Guillain-Barre syndrome: G61.0

## 2016-02-18 MED ORDER — HYDROCODONE-ACETAMINOPHEN 5-325 MG PO TABS: ORAL_TABLET | ORAL | Status: DC

## 2016-02-18 NOTE — ED Provider Notes (Signed)
CSN: [id number]     Arrival date & time 02/18/16  1206 History   First MD Initiated Contact with Patient 02/18/16 1304     Chief Complaint  Patient presents with  . Fall   (Consider location/radiation/quality/duration/timing/severity/associated sxs/prior Treatment) HPI Comments: 80 yo male presents with a complaint of right ribs pain since falling 2 days ago at home. States he slipped on his ramp at home while carrying his golf clubs and landed on his right side chest area. Denies hitting his head, loss of consciousness, numbness/tingling, shortness of breath.   The history is provided by the patient.    Past Medical History  Diagnosis Date  . Asthma   . Diabetes mellitus without complication (HCC)   . Depression   . Headache   . Arthritis   . Myocardial infarction (HCC)   . Sleep apnea   . Guillain Barr syndrome [facility](HCC)    Past Surgical History  Procedure Laterality Date  . Appendectomy    . Cervical spine surgery  2012   Family History  Problem Relation Age of Onset  . CAD     Social History  Substance Use Topics  . Smoking status: Former Games developermoker  . Smokeless tobacco: None  . Alcohol Use: 0.0 oz/week    0 Standard drinks or equivalent per week     Comment: occasionally    Review of Systems  Allergies  Penicillins  Home Medications   Prior to Admission medications   Medication Sig Start Date End Date Taking? Authorizing Provider  albuterol (PROVENTIL HFA;VENTOLIN HFA) 108 (90 Base) MCG/ACT inhaler Inhale 1 puff into the lungs every 6 (six) hours as needed for wheezing or shortness of breath.   Yes Historical Provider, MD  aspirin EC 81 MG tablet Take 81 mg by mouth daily.   Yes Historical Provider, MD  atorvastatin (LIPITOR) 40 MG tablet Take 1 tablet (40 mg total) by mouth daily. 09/30/15  Yes Ramonita LabAruna Gouru, MD  citalopram (CELEXA) 20 MG tablet Take 20 mg by mouth daily.   Yes Historical Provider, MD  clopidogrel (PLAVIX) 75 MG tablet Take 1 tablet (75 mg total) by  mouth daily. 09/30/15  Yes Ramonita LabAruna Gouru, MD  colchicine 0.6 MG tablet Take 0.6 mg by mouth as needed.   Yes Historical Provider, MD  diclofenac sodium (VOLTAREN) 1 % GEL Apply topically 4 (four) times daily.   Yes Historical Provider, MD  gabapentin (NEURONTIN) 100 MG capsule Take 500 mg by mouth daily.   Yes Historical Provider, MD  gabapentin (NEURONTIN) 400 MG capsule Take 2 capsules (800 mg total) by mouth at bedtime. 09/30/15  Yes Ramonita LabAruna Gouru, MD  hydrochlorothiazide (HYDRODIURIL) 12.5 MG tablet Take 12.5 mg by mouth daily.   Yes Historical Provider, MD  insulin detemir (LEVEMIR) 100 UNIT/ML injection Inject 36 Units into the skin 2 (two) times daily.   Yes Historical Provider, MD  insulin regular (NOVOLIN R,HUMULIN R) 100 units/mL injection Inject 30 Units into the skin every morning.   Yes Historical Provider, MD  insulin regular (NOVOLIN R,HUMULIN R) 100 units/mL injection Inject 34 Units into the skin at bedtime.   Yes Historical Provider, MD  lidocaine-prilocaine (EMLA) cream Apply 1 application topically as needed.   Yes Historical Provider, MD  losartan (COZAAR) 100 MG tablet Take 100 mg by mouth daily.   Yes Historical Provider, MD  magnesium oxide (MAG-OX) 400 MG tablet Take 800 mg by mouth every evening.   Yes Historical Provider, MD  metFORMIN (GLUCOPHAGE) 1000 MG tablet Take  1,000 mg by mouth 2 (two) times daily with a meal.   Yes Historical Provider, MD  omeprazole (PRILOSEC) 20 MG capsule Take 20 mg by mouth daily.   Yes Historical Provider, MD  pramipexole (MIRAPEX) 1 MG tablet Take 1 mg by mouth 3 (three) times daily.   Yes Historical Provider, MD  traMADol (ULTRAM) 50 MG tablet Take 50 mg by mouth every 6 (six) hours as needed.   Yes Historical Provider, MD  triamcinolone cream (KENALOG) 0.1 % Apply 1 application topically 2 (two) times daily.   Yes Historical Provider, MD  HYDROcodone-acetaminophen (NORCO/VICODIN) 5-325 MG tablet 1-2 tabs po q 8 hours prn 02/18/16   Payton Mccallumrlando Jeydi Klingel,  MD   Meds Ordered and Administered this Visit  Medications - No data to display  BP 140/69 mmHg  Pulse 96  Temp(Src) 97.8 F (36.6 C) (Tympanic)  Resp 16  Ht 5\' 11"  (1.803 m)  Wt 230 lb (104.327 kg)  BMI 32.09 kg/m2  SpO2 98% No data found.   Physical Exam  Constitutional: He appears well-developed and well-nourished. No distress.  Neck: Normal range of motion. Neck supple. No tracheal deviation present.  Cardiovascular: Normal rate, regular rhythm and normal heart sounds.   Pulmonary/Chest: Effort normal and breath sounds normal. No respiratory distress. He has no wheezes. He has no rales. He exhibits tenderness (right mid and lower chest wall ribs).  Neurological: He is alert.  Skin: Skin is warm and dry. No rash noted. He is not diaphoretic.  Nursing note and vitals reviewed.   ED Course  Procedures (including critical care time)  Labs Review Labs Reviewed - No data to display  Imaging Review Dg Ribs Unilateral W/chest Right  02/18/2016  CLINICAL DATA:  Right-sided rib pain after fall.  Initial encounter. EXAM: RIGHT RIBS AND CHEST - 3+ VIEW COMPARISON:  Chest x-ray 07/13/2010 FINDINGS: Anterior right sixth and seventh rib fractures with mild displacement. Mild basilar atelectasis. There is no edema, consolidation, effusion, or pneumothorax. Chronic mild cardiomegaly IMPRESSION: 1. Mildly displaced right sixth and seventh rib fractures. 2. Negative for hemothorax or pneumothorax. Electronically Signed   By: Marnee SpringJonathon  Watts M.D.   On: 02/18/2016 12:59     Visual Acuity Review  Right Eye Distance:   Left Eye Distance:   Bilateral Distance:    Right Eye Near:   Left Eye Near:    Bilateral Near:         MDM   1. Rib fracture, right, closed, initial encounter    Discharge Medication List as of 02/18/2016  1:38 PM    START taking these medications   Details  HYDROcodone-acetaminophen (NORCO/VICODIN) 5-325 MG tablet 1-2 tabs po q 8 hours prn, Print        1.  x-ray results and diagnosis reviewed with patient 2. rx as per orders above; reviewed possible side effects, interactions, risks and benefits  3. Recommend supportive treatment with ice, rest, otc analgesics  4. Follow-up prn if symptoms worsen or don't improve    Payton Mccallumrlando Rafik Koppel, MD 02/18/16 1845

## 2016-02-18 NOTE — Discharge Instructions (Signed)

## 2016-02-18 NOTE — ED Notes (Signed)
Patient complains of a recent fall down his ramp at his house while carrying golf clubs yesterday. Patient now presents with Right Rib Pain. Patient states that he fell on his back and landed on golf clubs. Patient states that he was able to play 18 holes yesterday.

## 2016-08-24 ENCOUNTER — Other Ambulatory Visit: Payer: Self-pay | Admitting: Nurse Practitioner

## 2016-08-24 DIAGNOSIS — R109 Unspecified abdominal pain: Secondary | ICD-10-CM

## 2016-08-24 DIAGNOSIS — R110 Nausea: Secondary | ICD-10-CM

## 2016-08-29 ENCOUNTER — Ambulatory Visit
Admission: RE | Admit: 2016-08-29 | Discharge: 2016-08-29 | Disposition: A | Discharge status: Home / Self Care | Payer: Medicare Other | Source: Ambulatory Visit | Attending: Nurse Practitioner | Admitting: Nurse Practitioner

## 2016-08-29 DIAGNOSIS — R109 Unspecified abdominal pain: Secondary | ICD-10-CM

## 2016-08-29 DIAGNOSIS — I313 Pericardial effusion (noninflammatory): Secondary | ICD-10-CM | POA: Insufficient documentation

## 2016-08-29 DIAGNOSIS — K760 Fatty (change of) liver, not elsewhere classified: Secondary | ICD-10-CM | POA: Diagnosis not present

## 2016-08-29 DIAGNOSIS — Z87898 Personal history of other specified conditions: Secondary | ICD-10-CM | POA: Diagnosis not present

## 2016-08-29 DIAGNOSIS — R110 Nausea: Secondary | ICD-10-CM

## 2016-08-29 DIAGNOSIS — K824 Cholesterolosis of gallbladder: Secondary | ICD-10-CM | POA: Diagnosis not present

## 2017-01-10 ENCOUNTER — Encounter: Payer: Self-pay | Admitting: Emergency Medicine

## 2017-01-10 ENCOUNTER — Emergency Department: Payer: Medicare Other

## 2017-01-10 ENCOUNTER — Emergency Department
Admission: EM | Admit: 2017-01-10 | Discharge: 2017-01-10 | Disposition: A | Discharge status: Home / Self Care | Payer: Medicare Other | Attending: Emergency Medicine | Admitting: Emergency Medicine

## 2017-01-10 ENCOUNTER — Other Ambulatory Visit: Payer: Self-pay

## 2017-01-10 DIAGNOSIS — Z7902 Long term (current) use of antithrombotics/antiplatelets: Secondary | ICD-10-CM | POA: Insufficient documentation

## 2017-01-10 DIAGNOSIS — R079 Chest pain, unspecified: Secondary | ICD-10-CM | POA: Diagnosis not present

## 2017-01-10 DIAGNOSIS — Z794 Long term (current) use of insulin: Secondary | ICD-10-CM | POA: Insufficient documentation

## 2017-01-10 DIAGNOSIS — Z87891 Personal history of nicotine dependence: Secondary | ICD-10-CM | POA: Diagnosis not present

## 2017-01-10 DIAGNOSIS — R0602 Shortness of breath: Secondary | ICD-10-CM | POA: Diagnosis present

## 2017-01-10 DIAGNOSIS — Z7901 Long term (current) use of anticoagulants: Secondary | ICD-10-CM | POA: Insufficient documentation

## 2017-01-10 DIAGNOSIS — J45909 Unspecified asthma, uncomplicated: Secondary | ICD-10-CM | POA: Insufficient documentation

## 2017-01-10 DIAGNOSIS — I509 Heart failure, unspecified: Secondary | ICD-10-CM | POA: Insufficient documentation

## 2017-01-10 DIAGNOSIS — I252 Old myocardial infarction: Secondary | ICD-10-CM | POA: Diagnosis not present

## 2017-01-10 DIAGNOSIS — I110 Hypertensive heart disease with heart failure: Secondary | ICD-10-CM | POA: Diagnosis not present

## 2017-01-10 DIAGNOSIS — E119 Type 2 diabetes mellitus without complications: Secondary | ICD-10-CM | POA: Insufficient documentation

## 2017-01-10 DIAGNOSIS — Z79899 Other long term (current) drug therapy: Secondary | ICD-10-CM | POA: Insufficient documentation

## 2017-01-10 HISTORY — DX: Essential (primary) hypertension: I10

## 2017-01-10 HISTORY — DX: Other pulmonary embolism without acute cor pulmonale: I26.99

## 2017-01-10 LAB — BRAIN NATRIURETIC PEPTIDE: B Natriuretic Peptide: 583 pg/mL — ABNORMAL HIGH (ref 0.0–100.0)

## 2017-01-10 LAB — BASIC METABOLIC PANEL
Anion gap: 9 (ref 5–15)
BUN: 11 mg/dL (ref 6–20)
CALCIUM: 8.9 mg/dL (ref 8.9–10.3)
CHLORIDE: 101 mmol/L (ref 101–111)
CO2: 30 mmol/L (ref 22–32)
CREATININE: 0.93 mg/dL (ref 0.61–1.24)
GFR calc Af Amer: >60 mL/min (ref >60)
GFR calc non Af Amer: >60 mL/min (ref >60)
GLUCOSE: 142 mg/dL — AB (ref 65–99)
Potassium: 4.2 mmol/L (ref 3.5–5.1)
Sodium: 140 mmol/L (ref 135–145)

## 2017-01-10 LAB — CBC
HCT: 34.8 % — ABNORMAL LOW (ref 40.0–52.0)
Hemoglobin: 11.3 g/dL — ABNORMAL LOW (ref 13.0–18.0)
MCH: 28.2 pg (ref 26.0–34.0)
MCHC: 32.5 g/dL (ref 32.0–36.0)
MCV: 86.5 fL (ref 80.0–100.0)
PLATELETS: 218 10*3/uL (ref 150–440)
RBC: 4.02 MIL/uL — ABNORMAL LOW (ref 4.40–5.90)
RDW: 14.3 % (ref 11.5–14.5)
WBC: 8.2 10*3/uL (ref 3.8–10.6)

## 2017-01-10 LAB — TROPONIN I: Troponin I: <0.03 ng/mL (ref <0.03)

## 2017-01-10 MED ORDER — FUROSEMIDE 10 MG/ML IJ SOLN
40.0000 mg | Freq: Once | INTRAMUSCULAR | Status: AC
Start: 2017-01-10 — End: 2017-01-10
  Administered 2017-01-10: 40 mg via INTRAVENOUS
  Filled 2017-01-10: qty 4

## 2017-01-10 MED ORDER — FUROSEMIDE 40 MG PO TABS: 40.0000 mg | ORAL_TABLET | Freq: Every day | ORAL | 0 refills | Status: DC

## 2017-01-10 MED ORDER — IOPAMIDOL (ISOVUE-370) INJECTION 76%
75.0000 mL | Freq: Once | INTRAVENOUS | Status: AC | PRN
  Administered 2017-01-10: 75 mL via INTRAVENOUS

## 2017-01-10 NOTE — Discharge Instructions (Signed)
Please take 40mg  of Lasix and do not drink an excessive amount of liquid for the next 5 days.  You can also weigh yourself daily and record the weights to bring with you to your cardiology appointment.  Return to the emergency department for shortness of breath, chest pain, lightheadedness or fainting, or for any other symptoms concerning to you.

## 2017-01-10 NOTE — ED Triage Notes (Signed)
Pt c/o tightness to chest and SHOB. Has had this every since PE that required intubation in march but has gotten worse over last 2 days. Not labored at this time. Wears 2 L [place] at all times.  Skin warm and dry.

## 2017-01-10 NOTE — ED Notes (Addendum)
Patient transported to US at this time.  

## 2017-01-10 NOTE — ED Notes (Signed)
Patient transported to CT 

## 2017-01-10 NOTE — ED Notes (Signed)
Patient in radiology at this time. Will assess once patient is in room.

## 2017-01-10 NOTE — ED Provider Notes (Signed)
[facility] Emergency Department Provider Note  ____________________________________________  Time seen: Approximately 4:15 PM  I have reviewed the triage vital signs and the nursing notes.   HISTORY  Chief Complaint Shortness of Breath and Chest Pain    HPI Colin Stokes is a 81 y.o. male with a recent hx of PE on Eliquis, hx of CAD status post MI, HTN, DM, asthma, presenting with shortness of breath and chest pain.The patient reports that for the past several weeks, he has noted exertional as well as nonexertional shortness of breath. Over the past several days, he has noticed increasing fatigue and decreased ability to perform his physical therapy exercises. Earlier today, the patient was wearing a shirt that had a tight band around the chest, resulting in chest pressure. When he removed the shirt, the pressure completely resolved. He has not had any other chest pain or pressure over the last several days. He has not had any cough or cold symptoms, palpitations, lightheadedness or syncope, fevers or chills.. No change in his baseline oxygen usage. At baseline for the past several years, the patient has had right greater than left lower extremity edema; this is unchanged.  Past Medical History:  Diagnosis Date  . Arthritis   . Asthma   . Depression   . Diabetes mellitus without complication (HCC)   . Guillain Barr syndrome (HCC)   . Headache   . Hypertension   . Myocardial infarction (HCC)   . Pulmonary embolism (HCC)   . Sleep apnea     Patient Active Problem List   Diagnosis Date Noted  . CIDP (chronic inflammatory demyelinating polyneuropathy) (HCC) 09/28/2015    Past Surgical History:  Procedure Laterality Date  . APPENDECTOMY    . CERVICAL SPINE SURGERY  2012    Current Outpatient Rx  . Order #: [id number] Class: Historical Med  . Order #: [id number] Class: Historical Med  . Order #: [id number] Class: Print  . Order #: [id number] Class:  Historical Med  . Order #: [id number] Class: Print  . Order #: [id number] Class: Historical Med  . Order #: [id number] Class: Historical Med  . Order #: [id number] Class: Historical Med  . Order #: [id number] Class: Print  . Order #: [id number] Class: Historical Med  . Order #: [id number] Class: Historical Med  . Order #: [id number] Class: Historical Med  . Order #: [id number] Class: Historical Med  . Order #: [id number] Class: Historical Med  . Order #: [id number] Class: Historical Med  . Order #: [id number] Class: Historical Med  . Order #: [id number] Class: Historical Med  . Order #: [id number] Class: Historical Med  . Order #: [id number] Class: Print    Allergies Penicillins  Family History  Problem Relation Age of Onset  . CAD Unknown     Social History Social History  Substance Use Topics  . Smoking status: Former Games developer  . Smokeless tobacco: Never Used  . Alcohol use 0.0 oz/week     Comment: occasionally    Review of Systems Constitutional: No fever/chills.No lightheadedness or syncope. Positive decreased exercise tolerance. Eyes: No visual changes. No eye discharge. ENT: No sore throat. No congestion or rhinorrhea. Cardiovascular: Positive chest pressure which resolved when he removed a tight shirt. Denies palpitations. Respiratory: Positive exertional shortness of breath.  No cough. Gastrointestinal: No abdominal pain.  No nausea, no vomiting.  No diarrhea.  No constipation. Genitourinary: Negative for dysuria. Musculoskeletal: Negative for back pain. Positive right greater than left lower extremity edema. Skin: Negative for rash. Neurological: Negative for headaches. No focal numbness, tingling or weakness.   10-point ROS otherwise  negative.  ____________________________________________   PHYSICAL EXAM:  VITAL SIGNS: ED Triage Vitals  Enc Vitals Group     BP 01/10/17 1608 (!) 147/89     Pulse Rate 01/10/17 1608 82     Resp 01/10/17 1608 (!) 22     Temp 01/10/17 1608 97.8 F  (36.6 C)     Temp Source 01/10/17 1608 Oral     SpO2 01/10/17 1608 98 %     Weight 01/10/17 1606 230 lb (104.3 kg)     Height 01/10/17 1606 5\' 11"  (1.803 m)     Head Circumference --      Peak Flow --      Pain Score 01/10/17 1605 4     Pain Loc --      Pain Edu? --      Excl. in GC? --     Constitutional: Alert and oriented. Well appearing and in no acute distress. Answers questions appropriately. Eyes: Conjunctivae are normal.  EOMI. No scleral icterus. Head: Atraumatic. Nose: No congestion/rhinnorhea. Mouth/Throat: Mucous membranes are moist.  Neck: No stridor.  Supple.  No JVD. No meningismus Cardiovascular: Normal rate, regular rhythm. No murmurs, rubs or gallops.  Respiratory: Normal respiratory effort.  No accessory muscle use or retractions. Lungs CTAB.  No wheezes, rales or ronchi. Gastrointestinal: Overweight. Soft, nontender and nondistended.  No guarding or rebound.  No peritoneal signs. Musculoskeletal: Positive right greater than left lower extremity edema that is pitting to the proximal tibial shaft. Neurologic:  A&Ox3.  Speech is clear.  Face and smile are symmetric.  EOMI.  Moves all extremities well. Skin:  Skin is warm, dry and intact. No rash noted. Psychiatric: Mood and affect are normal. Speech and behavior are normal.  Normal judgement.  ____________________________________________   LABS (all labs ordered are listed, but only abnormal results are displayed)  Labs Reviewed  BASIC METABOLIC PANEL - Abnormal; Notable for the following:       Result Value   Glucose, Bld 142 (*)    All other components within normal limits  CBC - Abnormal; Notable for the following:    RBC 4.02 (*)    Hemoglobin 11.3 (*)    HCT 34.8 (*)    All other components within normal limits  BRAIN NATRIURETIC PEPTIDE - Abnormal; Notable for the following:    B Natriuretic Peptide 583.0 (*)    All other components within normal limits  TROPONIN I    ____________________________________________  EKG  ED ECG REPORT I, Rockne Menghini, the attending physician, personally viewed and interpreted this ECG.   Date: 01/10/2017  EKG Time: 1608  Rate: 83  Rhythm: normal sinus rhythm  Axis: leftward  Intervals:first-degree A-V block   ST&T Change: No STEMI  ____________________________________________  RADIOLOGY  Dg Chest 2 View  Result Date: 01/10/2017 CLINICAL DATA:  Left-sided chest pain and worsening shortness of breath for 2 days. History of recent PE. EXAM: CHEST  2 VIEW COMPARISON:  02/18/2016 FINDINGS: The cardiac silhouette remains enlarged. There is peribronchial cuffing with diffusely increased interstitial markings compared to the prior study. Trace bilateral pleural effusions are present. No pneumothorax or acute osseous abnormality is seen. IMPRESSION: Cardiomegaly with trace pleural effusions and increased interstitial markings likely reflecting mild edema. Electronically Signed   By: Sebastian Ache M.D.   On: 01/10/2017 16:37   Ct Angio Chest Pe W Or Wo Contrast  Result Date: 01/10/2017 CLINICAL DATA:  81 year old male with chest tightness and shortness of breath. EXAM: CT ANGIOGRAPHY CHEST WITH  CONTRAST TECHNIQUE: Multidetector CT imaging of the chest was performed using the standard protocol during bolus administration of intravenous contrast. Multiplanar CT image reconstructions and MIPs were obtained to evaluate the vascular anatomy. CONTRAST:  75 cc Isovue 370 COMPARISON:  Chest radiograph dated 01/10/2017 FINDINGS: Cardiovascular: There is moderate cardiomegaly. Multi vessel coronary vascular calcification involving the left main, LAD, left circumflex artery, and RCA. There is no pericardial effusion. There is mild atherosclerotic calcification of the thoracic aorta. There is no aneurysmal dilatation. Check there is no CT evidence of pulmonary embolism. Mediastinum/Nodes: Top-normal right hilar lymph node measuring up  to 13 mm. No mediastinal adenopathy. There is a small hiatal hernia. The esophagus and thyroid gland are grossly unremarkable. Lungs/Pleura: Small bilateral pleural effusions. There is diffuse interstitial and interlobular septal prominence compatible with interstitial edema. Linear left lung base atelectatic changes/ scarring. There is associated subsegmental compressive atelectatic changes of the lung bases. Pneumonia is not excluded. Clinical correlation is recommended. The central airways are patent. There is no pneumothorax. Upper Abdomen: No acute abnormality. Musculoskeletal: There is mild degenerative changes of the spine. No acute osseous pathology. Review of the MIP images confirms the above findings. IMPRESSION: 1. No CT evidence of pulmonary embolism. 2. Moderate cardiomegaly with small bilateral pleural effusions and pulmonary edema consistent with CHF changes. Electronically Signed   By: Elgie Collard M.D.   On: 01/10/2017 18:32   US Venous Img Lower Bilateral  Result Date: 01/10/2017 CLINICAL DATA:  Bilateral lower extremity edema. EXAM: BILATERAL LOWER EXTREMITY VENOUS DOPPLER ULTRASOUND TECHNIQUE: Gray-scale sonography with graded compression, as well as color Doppler and duplex ultrasound were performed to evaluate the lower extremity deep venous systems from the level of the common femoral vein and including the common femoral, femoral, profunda femoral, popliteal and calf veins including the posterior tibial, peroneal and gastrocnemius veins when visible. The superficial great saphenous vein was also interrogated. Spectral Doppler was utilized to evaluate flow at rest and with distal augmentation maneuvers in the common femoral, femoral and popliteal veins. COMPARISON:  None. FINDINGS: RIGHT LOWER EXTREMITY Common Femoral Vein: No evidence of thrombus. Normal compressibility, respiratory phasicity and response to augmentation. Saphenofemoral Junction: No evidence of thrombus. Normal  compressibility and flow on color Doppler imaging. Profunda Femoral Vein: No evidence of thrombus. Normal compressibility and flow on color Doppler imaging. Femoral Vein: No evidence of thrombus. Normal compressibility, respiratory phasicity and response to augmentation. Popliteal Vein: No evidence of thrombus. Normal compressibility, respiratory phasicity and response to augmentation. Calf Veins: Right posterior tibial and peroneal vein evaluation was limited. No obvious calf vein DVT in the right lower extremity. Superficial Great Saphenous Vein: No evidence of thrombus. Normal compressibility and flow on color Doppler imaging. Venous Reflux:  None. Other Findings: Small complex fluid collection in the popliteal fossa is bilobed in shape and measures approximately 2 cm in transverse width and is consistent with a Baker's cyst. LEFT LOWER EXTREMITY Common Femoral Vein: No evidence of thrombus. Normal compressibility, respiratory phasicity and response to augmentation. Saphenofemoral Junction: No evidence of thrombus. Normal compressibility and flow on color Doppler imaging. Profunda Femoral Vein: No evidence of thrombus. Normal compressibility and flow on color Doppler imaging. Femoral Vein: No evidence of thrombus. Normal compressibility, respiratory phasicity and response to augmentation. Popliteal Vein: No evidence of thrombus. Normal compressibility, respiratory phasicity and response to augmentation. Calf Veins: No evidence of thrombus. Normal compressibility and flow on color Doppler imaging. Superficial Great Saphenous Vein: No evidence of thrombus. Normal compressibility and flow on  color Doppler imaging. Venous Reflux:  None. Other Findings: No evidence of superficial thrombophlebitis or abnormal fluid collection. IMPRESSION: No evidence of DVT within either lower extremity. Small bilobed shaped right popliteal fossa Baker's cyst identified. Electronically Signed   By: Irish LackGlenn  Yamagata M.D.   On: 01/10/2017  17:27    ____________________________________________   PROCEDURES  Procedure(s) performed: None  Procedures  Critical Care performed: No ____________________________________________   INITIAL IMPRESSION / ASSESSMENT AND PLAN / ED COURSE  Pertinent labs & imaging results that were available during my care of the patient were reviewed by me and considered in my medical decision making (see chart for details).  81 y.o. male with a history of PE on Eliquis, CAD s/p MI presenting with several weeks of progressively worsening exertional shortness of breath, and decreased exercise tolerance. Overall, the patient is chronically ill appearing but his vital signs are stable on his home 2 L nasal cannula supplemental oxygen. He does have some lower extremity swelling. We'll get ultrasounds of the extremities as well as CT angiogram to evaluate for blood clots in the legs or lungs. We'll get a troponin; at this time he has no ischemic changes on his EKG. Plan reevaluation for final disposition.  ----------------------------------------- 5:08 PM on 01/10/2017 -----------------------------------------  Clinically, the patient has bilateral lower extremity edema and exertional shortness of breath; his BNP is in the 500s and his chest x-ray shows pulmonary edema. His clinical picture is most consistent with new onset congestive heart failure, I'll treat him with Lasix and have him admitted for further evaluation and treatment.  ----------------------------------------- 6:45 PM on 01/10/2017 -----------------------------------------  The patient has had significant urine output after receiving Lasix and is feeling significantly better. He does have a history of CHF with a previous EF of 30%, and today his clinical picture is acute CHF exacerbation. He does not meet criteria for admission to the hospital and will be discharged home with close follow-up with his cardiologist Dr. Darrold JunkerParaschos.  I have  spoken with Dr. Lady GaryFath, the cardiologist on call about the plan and he is in agreement.  Return precautions as well as follow-up instructions were discussed with the patient and his wife.  ____________________________________________  FINAL CLINICAL IMPRESSION(S) / ED DIAGNOSES  Final diagnoses:  Shortness of breath  Acute on chronic congestive heart failure, unspecified heart failure type (HCC)         NEW MEDICATIONS STARTED DURING THIS VISIT:  Discharge Medication List as of 01/10/2017  6:43 PM    START taking these medications   Details  furosemide (LASIX) 40 MG tablet Take 1 tablet (40 mg total) by mouth daily., Starting Wed 01/10/2017, Until Thu 01/10/2018, Print          Rockne MenghiniNorman, Anne-Caroline, MD 01/10/17 2147

## 2017-02-08 ENCOUNTER — Encounter: Payer: Self-pay | Admitting: Family

## 2017-02-08 ENCOUNTER — Ambulatory Visit: Payer: Medicare Other | Attending: Family | Admitting: Family

## 2017-02-08 DIAGNOSIS — R531 Weakness: Secondary | ICD-10-CM | POA: Insufficient documentation

## 2017-02-08 DIAGNOSIS — Z880 Allergy status to penicillin: Secondary | ICD-10-CM | POA: Diagnosis not present

## 2017-02-08 DIAGNOSIS — F329 Major depressive disorder, single episode, unspecified: Secondary | ICD-10-CM | POA: Insufficient documentation

## 2017-02-08 DIAGNOSIS — E119 Type 2 diabetes mellitus without complications: Secondary | ICD-10-CM | POA: Insufficient documentation

## 2017-02-08 DIAGNOSIS — Z8673 Personal history of transient ischemic attack (TIA), and cerebral infarction without residual deficits: Secondary | ICD-10-CM | POA: Insufficient documentation

## 2017-02-08 DIAGNOSIS — I252 Old myocardial infarction: Secondary | ICD-10-CM | POA: Diagnosis not present

## 2017-02-08 DIAGNOSIS — R0602 Shortness of breath: Secondary | ICD-10-CM | POA: Diagnosis present

## 2017-02-08 DIAGNOSIS — Z87891 Personal history of nicotine dependence: Secondary | ICD-10-CM | POA: Diagnosis not present

## 2017-02-08 DIAGNOSIS — G4733 Obstructive sleep apnea (adult) (pediatric): Secondary | ICD-10-CM | POA: Diagnosis not present

## 2017-02-08 DIAGNOSIS — I5022 Chronic systolic (congestive) heart failure: Secondary | ICD-10-CM | POA: Diagnosis not present

## 2017-02-08 DIAGNOSIS — J45909 Unspecified asthma, uncomplicated: Secondary | ICD-10-CM | POA: Insufficient documentation

## 2017-02-08 DIAGNOSIS — Z86711 Personal history of pulmonary embolism: Secondary | ICD-10-CM | POA: Insufficient documentation

## 2017-02-08 DIAGNOSIS — Z79899 Other long term (current) drug therapy: Secondary | ICD-10-CM | POA: Diagnosis not present

## 2017-02-08 DIAGNOSIS — Z7901 Long term (current) use of anticoagulants: Secondary | ICD-10-CM | POA: Diagnosis not present

## 2017-02-08 DIAGNOSIS — Z794 Long term (current) use of insulin: Secondary | ICD-10-CM | POA: Diagnosis not present

## 2017-02-08 DIAGNOSIS — I10 Essential (primary) hypertension: Secondary | ICD-10-CM

## 2017-02-08 DIAGNOSIS — I110 Hypertensive heart disease with heart failure: Secondary | ICD-10-CM | POA: Diagnosis not present

## 2017-02-08 DIAGNOSIS — G610 Guillain-Barre syndrome: Secondary | ICD-10-CM | POA: Diagnosis not present

## 2017-02-08 DIAGNOSIS — R200 Anesthesia of skin: Secondary | ICD-10-CM | POA: Diagnosis not present

## 2017-02-08 DIAGNOSIS — Z7902 Long term (current) use of antithrombotics/antiplatelets: Secondary | ICD-10-CM | POA: Diagnosis not present

## 2017-02-08 NOTE — Progress Notes (Signed)
Patient ID: Colin Stokes, male    DOB: 10-Aug-1936, 81 y.o.   MRN: [id number]  HPI  Colin Stokes is a 81 y/o male with a history of obstructive sleep apnea, pulmonary embolus, CVA, MI, HTN, guillain barre, diabetes, depression, asthma, previous tobacco use and chronic heart failure.   Reviewed last echo done on 09/30/15 which showed an EF of 30-35% along with trivial Colin/TR.   Was in the ED 01/10/17 due to shortness of breath/chest pain related to HF. Treated and released home. Admitted 10/16/16 in [place], [place] with a pulmonary embolus.  He presents today for his initial visit with a chief complaint of mild shortness of breath with moderate exertion. He describes this as having been present for several years with varying levels of severity. He has associated fatigue, pedal edema, abdominal distention and weakness in his legs.  Past Medical History:  Diagnosis Date  . Arthritis   . Asthma   . Depression   . Diabetes mellitus without complication (HCC)   . Guillain Barr syndrome (HCC)   . Headache   . Hypertension   . Myocardial infarction (HCC)   . Pulmonary embolism (HCC)   . Sleep apnea   . Stroke [facility])    Past Surgical History:  Procedure Laterality Date  . APPENDECTOMY    . CERVICAL SPINE SURGERY  2012   Family History  Problem Relation Age of Onset  . CAD Unknown    Social History  Substance Use Topics  . Smoking status: Former Games developer  . Smokeless tobacco: Never Used  . Alcohol use 0.0 oz/week     Comment: occasionally   Allergies  Allergen Reactions  . Penicillins Rash    Has patient had a PCN reaction causing immediate rash, facial/tongue/throat swelling, SOB or lightheadedness with hypotension: No Has patient had a PCN reaction causing severe rash involving mucus membranes or skin necrosis: No Has patient had a PCN reaction that required hospitalization: No Has patient had a PCN reaction occurring within the last 10 years: No If all of the above answers are "NO",  then may proceed with Cephalosporin use.   Prior to Admission medications   Medication Sig Start Date End Date Taking? Authorizing Provider  albuterol (PROVENTIL HFA;VENTOLIN HFA) 108 (90 Base) MCG/ACT inhaler Inhale 1 puff into the lungs every 6 (six) hours as needed for wheezing or shortness of breath.   Yes [provider]  apixaban (ELIQUIS) 5 MG TABS tablet Take 5 mg by mouth 2 (two) times daily.   Yes [provider]  atorvastatin (LIPITOR) 40 MG tablet Take 1 tablet (40 mg total) by mouth daily. 09/30/15  Yes Gouru, Deanna Artis, MD  budesonide-formoterol (SYMBICORT) 160-4.5 MCG/ACT inhaler Inhale 2 puffs into the lungs 2 (two) times daily.   Yes [provider]  citalopram (CELEXA) 20 MG tablet Take 20 mg by mouth daily.   Yes [provider]  clopidogrel (PLAVIX) 75 MG tablet Take 1 tablet (75 mg total) by mouth daily. 09/30/15  Yes Gouru, Deanna Artis, MD  colchicine 0.6 MG tablet Take 0.6 mg by mouth daily as needed. For gout flare-ups   Yes [provider]  cyanocobalamin (,VITAMIN B-12,) 1000 MCG/ML injection Inject 1,000 mcg into the muscle every 30 (thirty) days.   Yes [provider]  esomeprazole (NEXIUM) 40 MG capsule Take 40 mg by mouth daily at 12 noon.   Yes [provider]  furosemide (LASIX) 40 MG tablet Take 1 tablet (40 mg total) by mouth daily. 01/10/17  01/10/18 Yes Rockne MenghiniNorman, Anne-Caroline, MD  gabapentin (NEURONTIN) 400 MG capsule Take 2 capsules (800 mg total) by mouth at bedtime. Patient taking differently: Take 300 mg by mouth 3 (three) times daily.  09/30/15  Yes Gouru, Aruna, MD  insulin NPH Human (HUMULIN N,NOVOLIN N) 100 UNIT/ML injection Inject 30-36 Units into the skin 2 (two) times daily. 30 unites every morning and 36 units at bedtime   Yes [provider]  insulin regular (NOVOLIN R,HUMULIN R) 100 units/mL injection Inject into the skin 3 (three) times daily before meals. Per sliding scale   Yes [provider]  LORazepam (ATIVAN) 0.5 MG tablet Take 0.5 mg by mouth as needed for anxiety.   Yes [provider]  losartan (COZAAR) 50 MG tablet Take 50 mg by mouth daily.   Yes [provider]  magnesium oxide (MAG-OX) 400 MG tablet Take 400 mg by mouth 2 (two) times daily.    Yes [provider]  metFORMIN (GLUCOPHAGE) 1000 MG tablet Take 1,000 mg by mouth 2 (two) times daily with a meal.   Yes [provider]  metoprolol succinate (TOPROL-XL) 25 MG 24 hr tablet Take 25 mg by mouth 1 (one) time daily.   Yes [provider]  potassium chloride (K-DUR) 10 MEQ tablet Take 10 mEq by mouth daily.   Yes [provider]  rOPINIRole (REQUIP) 0.25 MG tablet Take 0.25 mg by mouth 2 (two) times daily.   Yes [provider]  traZODone (DESYREL) 50 MG tablet Take 50 mg by mouth at bedtime.   Yes [provider]  triamcinolone cream (KENALOG) 0.1 % Apply 1 application topically 2 (two) times daily.   Yes [provider]    Review of Systems  Constitutional: Positive for fatigue. Negative for appetite change.  HENT: Negative for congestion, postnasal drip and sore throat.   Eyes: Positive for visual disturbance (decreased vision since stroke). Negative for pain.  Respiratory: Positive for shortness of breath. Negative for cough, chest tightness and wheezing.   Cardiovascular: Positive for leg swelling. Negative for chest pain and palpitations.  Gastrointestinal: Positive for abdominal distention. Negative for abdominal pain.  Endocrine: Negative.   Genitourinary: Negative.   Musculoskeletal: Positive for arthralgias (restless legs at times). Negative for back pain.  Skin: Negative.   Allergic/Immunologic: Negative.   Neurological: Positive for weakness and numbness (bilateral lower legs). Negative for dizziness.  Hematological: Negative for adenopathy. Does not bruise/bleed easily.  Psychiatric/Behavioral: Positive for  dysphoric mood. Negative for sleep disturbance (sleep in a recliner; wears oxygen at 2L around the clock). The patient is not nervous/anxious.    Vitals:   02/08/17 1123  BP: 111/64  Pulse: 78  Resp: 20  SpO2: 100%  Weight: 238 lb 2 oz (108 kg)  Height: 5\' 11"  (1.803 m)   Wt Readings from Last 3 Encounters:  02/08/17 238 lb 2 oz (108 kg)  01/10/17 230 lb (104.3 kg)  02/18/16 230 lb (104.3 kg)   Lab Results  Component Value Date   CREATININE 0.93 01/10/2017   CREATININE 1.15 09/28/2015    Physical Exam  Constitutional: He is oriented to person, place, and time. He appears well-developed and well-nourished.  HENT:  Head: Normocephalic and atraumatic.  Neck: Normal range of motion. Neck supple. No JVD present.  Cardiovascular: Normal rate and regular rhythm.   Pulmonary/Chest: Effort normal. He has no wheezes. He has no rales.  Abdominal: Soft. He exhibits distension. There is no tenderness.  Musculoskeletal: He exhibits edema (  trace edema bilateral lower legs with R>L). He exhibits no tenderness.  Neurological: He is alert and oriented to person, place, and time.  Skin: Skin is warm and dry.  Psychiatric: His behavior is normal. Thought content normal. His mood appears anxious.  Nursing note and vitals reviewed.   Assessment & Plan:  1: Chronic heart failure with reduced ejection fraction- - NYHA class II - euvolemic today - weighing daily and he was instructed to call for an overnight weight gain of >2 pounds or a weekly weight gain of >5 pounds - not adding salt to his food and he has been reading food labels some. Discussed the importance of closely following a 2000mg  sodium diet.  - tries to remain active and continues to golf. He will take his oxygen tank on the cart with him so he'll have it if he needs it - wears oxygen at 2L mostly around the clock  - discussed changing his losartan to entresto and a brochure was given to them about this. They will think about it  and check with their pharmacy and we will discuss at their next visit - PharmD went in and reviewed medications - saw cardiologist (Paraschos) 01/22/17 who resumed furosemide daily as well as potassium daily  2: HTN- - BP looks good today - saw PCP ([place]) 11/27/16  3: Guillain Barre`- - diagnosed in 2013 and was paralyzed from the neck down for 9 months - has chronic weakness and numbness in bilateral lower legs - saw neurologist Sherryll Burger) 12/06/16  Medication list was reviewed.  Return in 1 month or sooner for any questions/problems before then.

## 2017-02-08 NOTE — Patient Instructions (Signed)
Continue weighing daily and call for an overnight weight gain of > 2 pounds or a weekly weight gain of >5 pounds. 

## 2017-02-09 ENCOUNTER — Encounter: Payer: Self-pay | Admitting: Family

## 2017-02-09 DIAGNOSIS — G610 Guillain-Barre syndrome: Secondary | ICD-10-CM | POA: Insufficient documentation

## 2017-02-09 DIAGNOSIS — E119 Type 2 diabetes mellitus without complications: Secondary | ICD-10-CM | POA: Insufficient documentation

## 2017-02-09 DIAGNOSIS — I5022 Chronic systolic (congestive) heart failure: Secondary | ICD-10-CM | POA: Insufficient documentation

## 2017-02-09 DIAGNOSIS — I10 Essential (primary) hypertension: Secondary | ICD-10-CM | POA: Insufficient documentation

## 2017-03-02 HISTORY — PX: CATARACT EXTRACTION: SUR2

## 2017-03-05 ENCOUNTER — Other Ambulatory Visit: Payer: Self-pay | Admitting: Nurse Practitioner

## 2017-03-05 DIAGNOSIS — K824 Cholesterolosis of gallbladder: Secondary | ICD-10-CM

## 2017-03-08 ENCOUNTER — Encounter: Payer: Self-pay | Admitting: Family

## 2017-03-08 ENCOUNTER — Ambulatory Visit: Payer: Medicare Other | Attending: Family | Admitting: Family

## 2017-03-08 VITALS — BP 122/68 | HR 80 | Resp 18 | Ht 71.0 in | Wt 228.4 lb

## 2017-03-08 DIAGNOSIS — Z86711 Personal history of pulmonary embolism: Secondary | ICD-10-CM | POA: Diagnosis not present

## 2017-03-08 DIAGNOSIS — G610 Guillain-Barre syndrome: Secondary | ICD-10-CM | POA: Diagnosis not present

## 2017-03-08 DIAGNOSIS — Z8673 Personal history of transient ischemic attack (TIA), and cerebral infarction without residual deficits: Secondary | ICD-10-CM | POA: Diagnosis not present

## 2017-03-08 DIAGNOSIS — Z8249 Family history of ischemic heart disease and other diseases of the circulatory system: Secondary | ICD-10-CM | POA: Insufficient documentation

## 2017-03-08 DIAGNOSIS — E119 Type 2 diabetes mellitus without complications: Secondary | ICD-10-CM | POA: Diagnosis not present

## 2017-03-08 DIAGNOSIS — I252 Old myocardial infarction: Secondary | ICD-10-CM | POA: Insufficient documentation

## 2017-03-08 DIAGNOSIS — Z7902 Long term (current) use of antithrombotics/antiplatelets: Secondary | ICD-10-CM | POA: Diagnosis not present

## 2017-03-08 DIAGNOSIS — G4733 Obstructive sleep apnea (adult) (pediatric): Secondary | ICD-10-CM | POA: Insufficient documentation

## 2017-03-08 DIAGNOSIS — Z9889 Other specified postprocedural states: Secondary | ICD-10-CM | POA: Insufficient documentation

## 2017-03-08 DIAGNOSIS — I5022 Chronic systolic (congestive) heart failure: Secondary | ICD-10-CM | POA: Insufficient documentation

## 2017-03-08 DIAGNOSIS — I110 Hypertensive heart disease with heart failure: Secondary | ICD-10-CM | POA: Diagnosis not present

## 2017-03-08 DIAGNOSIS — Z880 Allergy status to penicillin: Secondary | ICD-10-CM | POA: Diagnosis not present

## 2017-03-08 DIAGNOSIS — J45909 Unspecified asthma, uncomplicated: Secondary | ICD-10-CM | POA: Diagnosis not present

## 2017-03-08 DIAGNOSIS — Z794 Long term (current) use of insulin: Secondary | ICD-10-CM | POA: Insufficient documentation

## 2017-03-08 DIAGNOSIS — Z87891 Personal history of nicotine dependence: Secondary | ICD-10-CM | POA: Diagnosis not present

## 2017-03-08 DIAGNOSIS — I10 Essential (primary) hypertension: Secondary | ICD-10-CM

## 2017-03-08 MED ORDER — SACUBITRIL-VALSARTAN 49-51 MG PO TABS: 1.0000 | ORAL_TABLET | Freq: Two times a day (BID) | ORAL | 5 refills | Status: DC

## 2017-03-08 NOTE — Progress Notes (Signed)
Patient ID: Colin Stokes, male    DOB: Jan 05, 1936, 81 y.o.   MRN: [id number]  HPI  Colin Stokes is a 81 y/o male with a history of obstructive sleep apnea, pulmonary embolus, CVA, MI, HTN, guillain barre, diabetes, depression, asthma, previous tobacco use and chronic heart failure.   Reviewed last echo done on 09/30/15 which showed an EF of 30-35% along with trivial Colin/TR.   Was in the ED 01/10/17 due to shortness of breath/chest pain related to HF. Treated and released home. Admitted 10/16/16 in [place], [place] with a pulmonary embolus.  He presents today for his follow-up visit with a chief complaint of mild shortness of breath with moderate exertion. He describes this as having been present for several years with varying levels of severity. He has associated fatigue, pedal edema, abdominal distention and weakness in his legs. Has started entresto 49/51mg  but has only been taking it once daily; tolerating it without known side effects.   Past Medical History:  Diagnosis Date  . Arthritis   . Asthma   . Depression   . Diabetes mellitus without complication (HCC)   . Guillain Barr syndrome (HCC)   . Headache   . Hypertension   . Myocardial infarction (HCC)   . Pulmonary embolism (HCC)   . Sleep apnea   . Stroke [facility])    Past Surgical History:  Procedure Laterality Date  . APPENDECTOMY    . CERVICAL SPINE SURGERY  2012   Family History  Problem Relation Age of Onset  . CAD Unknown    Social History  Substance Use Topics  . Smoking status: Former Games developer  . Smokeless tobacco: Never Used  . Alcohol use 0.0 oz/week     Comment: occasionally   Allergies  Allergen Reactions  . Penicillins Rash    Has patient had a PCN reaction causing immediate rash, facial/tongue/throat swelling, SOB or lightheadedness with hypotension: No Has patient had a PCN reaction causing severe rash involving mucus membranes or skin necrosis: No Has patient had a PCN reaction that required  hospitalization: No Has patient had a PCN reaction occurring within the last 10 years: No If all of the above answers are "NO", then may proceed with Cephalosporin use.   Prior to Admission medications   Medication Sig Start Date End Date Taking? Authorizing Provider  albuterol (PROVENTIL HFA;VENTOLIN HFA) 108 (90 Base) MCG/ACT inhaler Inhale 1 puff into the lungs every 6 (six) hours as needed for wheezing or shortness of breath.   Yes [provider]  apixaban (ELIQUIS) 5 MG TABS tablet Take 5 mg by mouth 2 (two) times daily.   Yes [provider]  atorvastatin (LIPITOR) 40 MG tablet Take 1 tablet (40 mg total) by mouth daily. 09/30/15  Yes Gouru, Deanna Artis, MD  budesonide-formoterol (SYMBICORT) 160-4.5 MCG/ACT inhaler Inhale 2 puffs into the lungs 2 (two) times daily.   Yes [provider]  citalopram (CELEXA) 20 MG tablet Take 20 mg by mouth daily.   Yes [provider]  clopidogrel (PLAVIX) 75 MG tablet Take 1 tablet (75 mg total) by mouth daily. 09/30/15  Yes Gouru, Deanna Artis, MD  colchicine 0.6 MG tablet Take 0.6 mg by mouth daily as needed. For gout flare-ups   Yes [provider]  cyanocobalamin (,VITAMIN B-12,) 1000 MCG/ML injection Inject 1,000 mcg into the muscle every 30 (thirty) days.   Yes [provider]  esomeprazole (NEXIUM) 40 MG capsule Take 40 mg by mouth daily at 12 noon.   Yes  [provider]  furosemide (LASIX) 40 MG tablet Take 1 tablet (40 mg total) by mouth daily. 01/10/17 01/10/18 Yes Rockne MenghiniNorman, Anne-Caroline, MD  gabapentin (NEURONTIN) 400 MG capsule Take 2 capsules (800 mg total) by mouth at bedtime. Patient taking differently: Take 300 mg by mouth 3 (three) times daily.  09/30/15  Yes Gouru, Aruna, MD  insulin NPH Human (HUMULIN N,NOVOLIN N) 100 UNIT/ML injection Inject 30-36 Units into the skin 2 (two) times daily. 30 unites every morning and 36 units at bedtime   Yes [provider]  insulin regular (NOVOLIN  R,HUMULIN R) 100 units/mL injection Inject into the skin 3 (three) times daily before meals. Per sliding scale   Yes [provider]  LORazepam (ATIVAN) 0.5 MG tablet Take 0.5 mg by mouth as needed for anxiety.   Yes [provider]  magnesium oxide (MAG-OX) 400 MG tablet Take 400 mg by mouth 2 (two) times daily.    Yes [provider]  metFORMIN (GLUCOPHAGE) 1000 MG tablet Take 1,000 mg by mouth 2 (two) times daily with a meal.   Yes [provider]  metoprolol succinate (TOPROL-XL) 25 MG 24 hr tablet Take 25 mg by mouth daily.    Yes [provider]  moxifloxacin (VIGAMOX) 0.5 % ophthalmic solution Place 1 drop into the right eye 4 (four) times daily.   Yes [provider]  nepafenac (ILEVRO) 0.3 % ophthalmic suspension Place 1 drop into the right eye daily.   Yes [provider]  potassium chloride (K-DUR) 10 MEQ tablet Take 10 mEq by mouth daily.   Yes [provider]  prednisoLONE acetate (PRED FORTE) 1 % ophthalmic suspension Place 1 drop into the right eye 4 (four) times daily.   Yes [provider]  rOPINIRole (REQUIP) 0.25 MG tablet Take 0.25 mg by mouth 2 (two) times daily.   Yes [provider]  sacubitril-valsartan (ENTRESTO) 49-51 MG Take 1 tablet by mouth 2 (two) times daily. 03/08/17  Yes Johnney Scarlata, Inetta Fermoina A, FNP  traZODone (DESYREL) 50 MG tablet Take 50 mg by mouth at bedtime.   Yes [provider]  triamcinolone cream (KENALOG) 0.1 % Apply 1 application topically 2 (two) times daily.   Yes [provider]   Review of Systems  Constitutional: Positive for fatigue. Negative for appetite change.  HENT: Negative for congestion, postnasal drip and sore throat.   Eyes: Positive for visual disturbance (decreased vision since stroke). Negative for pain.  Respiratory: Positive for shortness of breath. Negative for cough, chest tightness and wheezing.   Cardiovascular: Positive for leg  swelling. Negative for chest pain and palpitations.  Gastrointestinal: Positive for abdominal distention. Negative for abdominal pain.  Endocrine: Negative.   Genitourinary: Negative.   Musculoskeletal: Positive for arthralgias (restless legs at times). Negative for back pain.  Skin: Negative.   Allergic/Immunologic: Negative.   Neurological: Positive for weakness and numbness (bilateral lower legs). Negative for dizziness.  Hematological: Negative for adenopathy. Does not bruise/bleed easily.  Psychiatric/Behavioral: Positive for dysphoric mood. Negative for sleep disturbance (sleep in a recliner; wears oxygen at 2L around the clock). The patient is not nervous/anxious.    Vitals:   03/08/17 1110  BP: 122/68  Pulse: 80  Resp: 18  SpO2: 92%  Weight: 228 lb 6 oz (103.6 kg)  Height: 5\' 11"  (1.803 m)   Wt Readings from Last 3 Encounters:  03/08/17 228 lb 6 oz (103.6 kg)  02/08/17 238 lb 2 oz (108 kg)  01/10/17 230 lb (104.3  kg)    Lab Results  Component Value Date   CREATININE 0.93 01/10/2017   CREATININE 1.15 09/28/2015    Physical Exam  Constitutional: He is oriented to person, place, and time. He appears well-developed and well-nourished.  HENT:  Head: Normocephalic and atraumatic.  Neck: Normal range of motion. Neck supple. No JVD present.  Cardiovascular: Normal rate and regular rhythm.   Pulmonary/Chest: Effort normal. He has no wheezes. He has no rales.  Abdominal: Soft. He exhibits distension. There is no tenderness.  Musculoskeletal: He exhibits edema (trace edema right lower leg but none in left). He exhibits no tenderness.  Neurological: He is alert and oriented to person, place, and time.  Skin: Skin is warm and dry.  Psychiatric: His behavior is normal. Thought content normal. His mood appears anxious.  Nursing note and vitals reviewed.   Assessment & Plan:  1: Chronic heart failure with reduced ejection fraction- - NYHA class II - euvolemic today -  weighing daily and he was instructed to call for an overnight weight gain of >2 pounds or a weekly weight gain of >5 pounds - weight down 10 pounds by our scale - not adding salt to his food and he has been reading food labels some. Discussed the importance of closely following a 2000mg  sodium diet.  - tries to remain active and continues to golf. He will take his oxygen tank on the cart with him so he'll have it if he needs it - wears oxygen at 2L mostly around the clock  - has been taking entresto 49/51mg  once daily; instructed to increase it to BID and new prescription provided along with a 30 day voucher card - started the medication on 02/28/17 so will plan on getting labs at his next visit - saw cardiologist (Paraschos) 02/27/17 and returns 05/29/17  2: HTN- - BP looks good today - decrease furosemide to 20mg  daily; can take a whole 40mg  tablet for above weight gain parameters, worsening edema or shortness of breath - saw PCP (Sparks) 11/27/16  3: Guillain Barre`- - diagnosed in 2013 and 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  Return in 1 month or sooner for any questions/problems before then.

## 2017-03-08 NOTE — Patient Instructions (Addendum)
Continue weighing daily and call for an overnight weight gain of > 2 pounds or a weekly weight gain of >5 pounds.  Decrease fluid pill to 1/2 tablet daily (20mg )

## 2017-03-13 ENCOUNTER — Ambulatory Visit
Admission: RE | Admit: 2017-03-13 | Discharge: 2017-03-13 | Disposition: A | Discharge status: Home / Self Care | Payer: Medicare Other | Source: Ambulatory Visit | Attending: Nurse Practitioner | Admitting: Nurse Practitioner

## 2017-03-13 DIAGNOSIS — R935 Abnormal findings on diagnostic imaging of other abdominal regions, including retroperitoneum: Secondary | ICD-10-CM | POA: Diagnosis present

## 2017-03-13 DIAGNOSIS — K7689 Other specified diseases of liver: Secondary | ICD-10-CM | POA: Diagnosis not present

## 2017-03-13 DIAGNOSIS — K824 Cholesterolosis of gallbladder: Secondary | ICD-10-CM | POA: Diagnosis not present

## 2017-03-13 DIAGNOSIS — R160 Hepatomegaly, not elsewhere classified: Secondary | ICD-10-CM | POA: Diagnosis not present

## 2017-03-14 ENCOUNTER — Other Ambulatory Visit: Payer: Self-pay | Admitting: Nurse Practitioner

## 2017-03-14 DIAGNOSIS — R935 Abnormal findings on diagnostic imaging of other abdominal regions, including retroperitoneum: Secondary | ICD-10-CM

## 2017-03-14 DIAGNOSIS — K769 Liver disease, unspecified: Secondary | ICD-10-CM

## 2017-03-15 ENCOUNTER — Other Ambulatory Visit: Payer: Self-pay | Admitting: Nurse Practitioner

## 2017-03-15 DIAGNOSIS — K769 Liver disease, unspecified: Secondary | ICD-10-CM

## 2017-03-15 DIAGNOSIS — R935 Abnormal findings on diagnostic imaging of other abdominal regions, including retroperitoneum: Secondary | ICD-10-CM

## 2017-03-16 ENCOUNTER — Ambulatory Visit
Admission: RE | Admit: 2017-03-16 | Discharge: 2017-03-16 | Disposition: A | Discharge status: Home / Self Care | Payer: Medicare Other | Source: Ambulatory Visit | Attending: Nurse Practitioner | Admitting: Nurse Practitioner

## 2017-03-16 DIAGNOSIS — K769 Liver disease, unspecified: Secondary | ICD-10-CM

## 2017-03-16 DIAGNOSIS — R935 Abnormal findings on diagnostic imaging of other abdominal regions, including retroperitoneum: Secondary | ICD-10-CM | POA: Diagnosis not present

## 2017-03-16 DIAGNOSIS — K7689 Other specified diseases of liver: Secondary | ICD-10-CM | POA: Diagnosis not present

## 2017-03-16 MED ORDER — GADOBENATE DIMEGLUMINE 529 MG/ML IV SOLN
20.0000 mL | Freq: Once | INTRAVENOUS | Status: AC | PRN
  Administered 2017-03-16: 20 mL via INTRAVENOUS

## 2017-04-03 ENCOUNTER — Ambulatory Visit: Payer: Medicare Other | Admitting: Family

## 2017-04-05 ENCOUNTER — Encounter: Payer: Self-pay | Admitting: Family

## 2017-04-05 ENCOUNTER — Telehealth: Payer: Self-pay | Admitting: Family

## 2017-04-05 ENCOUNTER — Ambulatory Visit: Payer: Medicare Other | Attending: Family | Admitting: Family

## 2017-04-05 VITALS — BP 117/67 | HR 92 | Resp 18 | Ht 71.0 in | Wt 224.4 lb

## 2017-04-05 DIAGNOSIS — I252 Old myocardial infarction: Secondary | ICD-10-CM | POA: Insufficient documentation

## 2017-04-05 DIAGNOSIS — I110 Hypertensive heart disease with heart failure: Secondary | ICD-10-CM | POA: Insufficient documentation

## 2017-04-05 DIAGNOSIS — I5022 Chronic systolic (congestive) heart failure: Secondary | ICD-10-CM | POA: Insufficient documentation

## 2017-04-05 DIAGNOSIS — Z87891 Personal history of nicotine dependence: Secondary | ICD-10-CM | POA: Insufficient documentation

## 2017-04-05 DIAGNOSIS — J45909 Unspecified asthma, uncomplicated: Secondary | ICD-10-CM | POA: Insufficient documentation

## 2017-04-05 DIAGNOSIS — Z7901 Long term (current) use of anticoagulants: Secondary | ICD-10-CM | POA: Insufficient documentation

## 2017-04-05 DIAGNOSIS — Z79899 Other long term (current) drug therapy: Secondary | ICD-10-CM | POA: Diagnosis not present

## 2017-04-05 DIAGNOSIS — R200 Anesthesia of skin: Secondary | ICD-10-CM | POA: Diagnosis not present

## 2017-04-05 DIAGNOSIS — Z86711 Personal history of pulmonary embolism: Secondary | ICD-10-CM | POA: Diagnosis not present

## 2017-04-05 DIAGNOSIS — R0602 Shortness of breath: Secondary | ICD-10-CM | POA: Diagnosis present

## 2017-04-05 DIAGNOSIS — E119 Type 2 diabetes mellitus without complications: Secondary | ICD-10-CM | POA: Diagnosis not present

## 2017-04-05 DIAGNOSIS — F329 Major depressive disorder, single episode, unspecified: Secondary | ICD-10-CM | POA: Insufficient documentation

## 2017-04-05 DIAGNOSIS — G4733 Obstructive sleep apnea (adult) (pediatric): Secondary | ICD-10-CM | POA: Diagnosis not present

## 2017-04-05 DIAGNOSIS — Z7902 Long term (current) use of antithrombotics/antiplatelets: Secondary | ICD-10-CM | POA: Insufficient documentation

## 2017-04-05 DIAGNOSIS — Z880 Allergy status to penicillin: Secondary | ICD-10-CM | POA: Diagnosis not present

## 2017-04-05 DIAGNOSIS — R531 Weakness: Secondary | ICD-10-CM | POA: Diagnosis not present

## 2017-04-05 DIAGNOSIS — G610 Guillain-Barre syndrome: Secondary | ICD-10-CM

## 2017-04-05 DIAGNOSIS — Z794 Long term (current) use of insulin: Secondary | ICD-10-CM | POA: Insufficient documentation

## 2017-04-05 DIAGNOSIS — Z8673 Personal history of transient ischemic attack (TIA), and cerebral infarction without residual deficits: Secondary | ICD-10-CM | POA: Diagnosis not present

## 2017-04-05 DIAGNOSIS — I10 Essential (primary) hypertension: Secondary | ICD-10-CM

## 2017-04-05 LAB — BASIC METABOLIC PANEL
ANION GAP: 11 (ref 5–15)
BUN: 28 mg/dL — ABNORMAL HIGH (ref 6–20)
CALCIUM: 9.2 mg/dL (ref 8.9–10.3)
CO2: 30 mmol/L (ref 22–32)
Chloride: 96 mmol/L — ABNORMAL LOW (ref 101–111)
Creatinine, Ser: 1.49 mg/dL — ABNORMAL HIGH (ref 0.61–1.24)
GFR, EST AFRICAN AMERICAN: 50 mL/min — AB (ref >60)
GFR, EST NON AFRICAN AMERICAN: 43 mL/min — AB (ref >60)
GLUCOSE: 196 mg/dL — AB (ref 65–99)
Potassium: 4.3 mmol/L (ref 3.5–5.1)
SODIUM: 137 mmol/L (ref 135–145)

## 2017-04-05 NOTE — Progress Notes (Signed)
Patient ID: Colin Stokes, male    DOB: [birth year], 81 y.o.   MRN: [id number]  HPI  Colin Stokes is a 81 y/o male with a history of obstructive sleep apnea, pulmonary embolus, CVA, MI, HTN, guillain barre, diabetes, depression, asthma, previous tobacco use and chronic heart failure.   Reviewed last echo done on 09/30/15 which showed an EF of 30-35% along with trivial Colin/TR.   Was in the ED 01/10/17 due to shortness of breath/chest pain related to HF. Treated and released home. Admitted 10/16/16 in [place], [place] with a pulmonary embolus.  He presents today for his follow-up visit with a chief complaint of mild shortness of breath with moderate exertion. He says that this is chronic in nature having been present for a few years. He doesn't feel like it's any worse or any better. He has associated fatigue, numbness in legs and weakness along with this. He denies any weight gain, chest pain or edema in his lower legs.   Past Medical History:  Diagnosis Date  . Arthritis   . Asthma   . Depression   . Diabetes mellitus without complication (HCC)   . Guillain Barr syndrome (HCC)   . Headache   . Hypertension   . Myocardial infarction (HCC)   . Pulmonary embolism (HCC)   . Sleep apnea   . Stroke [facility] [place] [facility])    Past Surgical History:  Procedure Laterality Date  . APPENDECTOMY    . CATARACT EXTRACTION Right 03/02/2017  . CERVICAL SPINE SURGERY  2012   Family History  Problem Relation Age of Onset  . CAD Unknown    Social History  Substance Use Topics  . Smoking status: Former Games developer  . Smokeless tobacco: Never Used  . Alcohol use 0.0 oz/week     Comment: occasionally   Allergies  Allergen Reactions  . Penicillins Rash    Has patient had a PCN reaction causing immediate rash, facial/tongue/throat swelling, SOB or lightheadedness with hypotension: No Has patient had a PCN reaction causing severe rash involving mucus membranes or skin necrosis: No Has patient had a PCN reaction that  required hospitalization: No Has patient had a PCN reaction occurring within the last 10 years: No If all of the above answers are "NO", then may proceed with Cephalosporin use.   Prior to Admission medications   Medication Sig Start Date End Date Taking? Authorizing Provider  albuterol (PROVENTIL HFA;VENTOLIN HFA) 108 (90 Base) MCG/ACT inhaler Inhale 1 puff into the lungs every 6 (six) hours as needed for wheezing or shortness of breath.   Yes [provider]  apixaban (ELIQUIS) 5 MG TABS tablet Take 5 mg by mouth 2 (two) times daily.   Yes [provider]  atorvastatin (LIPITOR) 40 MG tablet Take 1 tablet (40 mg total) by mouth daily. 09/30/15  Yes Gouru, Deanna Artis, MD  budesonide-formoterol (SYMBICORT) 160-4.5 MCG/ACT inhaler Inhale 2 puffs into the lungs 2 (two) times daily.   Yes [provider]  citalopram (CELEXA) 20 MG tablet Take 20 mg by mouth daily.   Yes [provider]  clopidogrel (PLAVIX) 75 MG tablet Take 1 tablet (75 mg total) by mouth daily. 09/30/15  Yes Gouru, Deanna Artis, MD  colchicine 0.6 MG tablet Take 0.6 mg by mouth daily as needed. For gout flare-ups   Yes [provider]  cyanocobalamin (,VITAMIN B-12,) 1000 MCG/ML injection Inject 1,000 mcg into the muscle every 30 (thirty) days.   Yes [provider]  esomeprazole (NEXIUM) 40 MG capsule Take 40  mg by mouth daily at 12 noon.   Yes [provider]  furosemide (LASIX) 40 MG tablet Take 1 tablet (40 mg total) by mouth daily. 01/10/17 01/10/18 Yes Rockne Menghini, MD  gabapentin (NEURONTIN) 400 MG capsule Take 2 capsules (800 mg total) by mouth at bedtime. Patient taking differently: Take 300 mg by mouth 3 (three) times daily.  09/30/15  Yes Gouru, Aruna, MD  insulin regular (NOVOLIN R,HUMULIN R) 100 units/mL injection Inject into the skin 3 (three) times daily before meals. Per sliding scale   Yes [provider]  LORazepam (ATIVAN) 0.5 MG tablet Take 0.5 mg  by mouth as needed for anxiety.   Yes [provider]  magnesium oxide (MAG-OX) 400 MG tablet Take 400 mg by mouth 2 (two) times daily.    Yes [provider]  metFORMIN (GLUCOPHAGE) 1000 MG tablet Take 1,000 mg by mouth 2 (two) times daily with a meal.   Yes [provider]  metoprolol succinate (TOPROL-XL) 25 MG 24 hr tablet Take 25 mg by mouth daily.    Yes [provider]  potassium chloride (K-DUR) 10 MEQ tablet Take 10 mEq by mouth daily.   Yes [provider]  rOPINIRole (REQUIP) 0.25 MG tablet Take 0.25 mg by mouth 2 (two) times daily.   Yes [provider]  sacubitril-valsartan (ENTRESTO) 49-51 MG Take 1 tablet by mouth 2 (two) times daily. 03/08/17  Yes Yesika Rispoli, Inetta Fermo A, FNP  traZODone (DESYREL) 50 MG tablet Take 50 mg by mouth at bedtime.   Yes [provider]  triamcinolone cream (KENALOG) 0.1 % Apply 1 application topically 2 (two) times daily.   Yes [provider]  insulin NPH Human (HUMULIN N,NOVOLIN N) 100 UNIT/ML injection Inject 30-36 Units into the skin 2 (two) times daily. 30 unites every morning and 36 units at bedtime    [provider]    Review of Systems  Constitutional: Positive for fatigue. Negative for appetite change.  HENT: Negative for congestion, postnasal drip and sore throat.   Eyes: Positive for visual disturbance (decreased vision since stroke). Negative for pain.  Respiratory: Positive for shortness of breath. Negative for cough, chest tightness and wheezing.   Cardiovascular: Negative for chest pain, palpitations and leg swelling.  Gastrointestinal: Positive for nausea (at times). Negative for abdominal distention and abdominal pain.  Endocrine: Negative.   Genitourinary: Negative.   Musculoskeletal: Positive for arthralgias (restless legs at times) and neck pain. Negative for back pain.  Skin: Negative.   Allergic/Immunologic: Negative.   Neurological: Positive for weakness  and numbness (bilateral lower legs). Negative for dizziness.  Hematological: Negative for adenopathy. Does not bruise/bleed easily.  Psychiatric/Behavioral: Positive for dysphoric mood. Negative for sleep disturbance (sleep in a recliner; wears oxygen at 2L around the clock). The patient is not nervous/anxious.    Vitals:   04/05/17 1023  BP: 117/67  Pulse: 92  Resp: 18  SpO2: 96%  Weight: 224 lb 6 oz (101.8 kg)  Height: 5\' 11"  (1.803 m)   Wt Readings from Last 3 Encounters:  04/05/17 224 lb 6 oz (101.8 kg)  03/08/17 228 lb 6 oz (103.6 kg)  02/08/17 238 lb 2 oz (108 kg)    Lab Results  Component Value Date   CREATININE 0.93 01/10/2017   CREATININE 1.15 09/28/2015    Physical Exam  Constitutional: He is oriented to person, place, and time. He appears well-developed and well-nourished.  HENT:  Head: Normocephalic and atraumatic.  Neck: Normal range of motion.  Neck supple. No JVD present.  Cardiovascular: Normal rate and regular rhythm.   Pulmonary/Chest: Effort normal. He has no wheezes. He has no rales.  Abdominal: Soft. He exhibits no distension. There is no tenderness.  Musculoskeletal: He exhibits no edema or tenderness.  Neurological: He is alert and oriented to person, place, and time.  Skin: Skin is warm and dry.  Psychiatric: His behavior is normal. Thought content normal. His mood appears anxious.  Nursing note and vitals reviewed.   Assessment & Plan:  1: Chronic heart failure with reduced ejection fraction- - NYHA class II - euvolemic today - weighing daily and he was instructed to call for an overnight weight gain of >2 pounds or a weekly weight gain of >5 pounds - weight down another 4 pounds by our scale - not adding salt to his food and he has been reading food labels  - tries to remain active and continues to golf. 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   Return in 6 months or sooner for any questions/problems before then.

## 2017-04-05 NOTE — Telephone Encounter (Signed)
Spoke with patient's wife regarding lab work that was obtained today (04/05/17). Potassium level is normal but renal function has decreased slightly since taking the entresto BID. Advised her to have a provider recheck his renal function in the next 1-2 weeks to make sure that it's doing ok. She verbalized understanding and said that she would make sure it gets re-checked.

## 2017-04-05 NOTE — Patient Instructions (Signed)
Continue weighing daily and call for an overnight weight gain of > 2 pounds or a weekly weight gain of >5 pounds. 

## 2017-06-06 ENCOUNTER — Other Ambulatory Visit: Payer: Self-pay | Admitting: Internal Medicine

## 2017-06-06 DIAGNOSIS — G459 Transient cerebral ischemic attack, unspecified: Secondary | ICD-10-CM

## 2017-06-13 ENCOUNTER — Ambulatory Visit
Admission: RE | Admit: 2017-06-13 | Discharge: 2017-06-13 | Disposition: A | Discharge status: Home / Self Care | Payer: Medicare Other | Source: Ambulatory Visit | Attending: Internal Medicine | Admitting: Internal Medicine

## 2017-06-13 DIAGNOSIS — I63531 Cerebral infarction due to unspecified occlusion or stenosis of right posterior cerebral artery: Secondary | ICD-10-CM | POA: Diagnosis not present

## 2017-06-13 DIAGNOSIS — G459 Transient cerebral ischemic attack, unspecified: Secondary | ICD-10-CM | POA: Insufficient documentation

## 2017-06-22 ENCOUNTER — Other Ambulatory Visit: Payer: Self-pay

## 2017-06-22 ENCOUNTER — Encounter: Payer: Self-pay | Admitting: Emergency Medicine

## 2017-06-22 ENCOUNTER — Emergency Department: Payer: Medicare Other

## 2017-06-22 ENCOUNTER — Emergency Department
Admission: EM | Admit: 2017-06-22 | Discharge: 2017-06-22 | Disposition: A | Discharge status: Home / Self Care | Payer: Medicare Other | Attending: Student in an Organized Health Care Education/Training Program | Admitting: Student in an Organized Health Care Education/Training Program

## 2017-06-22 DIAGNOSIS — G610 Guillain-Barre syndrome: Secondary | ICD-10-CM | POA: Diagnosis not present

## 2017-06-22 DIAGNOSIS — E119 Type 2 diabetes mellitus without complications: Secondary | ICD-10-CM | POA: Insufficient documentation

## 2017-06-22 DIAGNOSIS — I252 Old myocardial infarction: Secondary | ICD-10-CM | POA: Diagnosis not present

## 2017-06-22 DIAGNOSIS — Z79899 Other long term (current) drug therapy: Secondary | ICD-10-CM | POA: Diagnosis not present

## 2017-06-22 DIAGNOSIS — Z794 Long term (current) use of insulin: Secondary | ICD-10-CM | POA: Insufficient documentation

## 2017-06-22 DIAGNOSIS — I5022 Chronic systolic (congestive) heart failure: Secondary | ICD-10-CM | POA: Insufficient documentation

## 2017-06-22 DIAGNOSIS — Z8673 Personal history of transient ischemic attack (TIA), and cerebral infarction without residual deficits: Secondary | ICD-10-CM | POA: Insufficient documentation

## 2017-06-22 DIAGNOSIS — R410 Disorientation, unspecified: Secondary | ICD-10-CM | POA: Diagnosis not present

## 2017-06-22 DIAGNOSIS — Z86711 Personal history of pulmonary embolism: Secondary | ICD-10-CM | POA: Insufficient documentation

## 2017-06-22 DIAGNOSIS — R4182 Altered mental status, unspecified: Secondary | ICD-10-CM | POA: Diagnosis present

## 2017-06-22 DIAGNOSIS — I110 Hypertensive heart disease with heart failure: Secondary | ICD-10-CM | POA: Diagnosis not present

## 2017-06-22 DIAGNOSIS — Z7901 Long term (current) use of anticoagulants: Secondary | ICD-10-CM | POA: Diagnosis not present

## 2017-06-22 LAB — DIFFERENTIAL
Basophils Absolute: 0.1 10*3/uL (ref 0–0.1)
Basophils Relative: 1 %
EOS PCT: 1 %
Eosinophils Absolute: 0.1 10*3/uL (ref 0–0.7)
LYMPHS PCT: 16 %
Lymphs Abs: 1.3 10*3/uL (ref 1.0–3.6)
Monocytes Absolute: 0.7 10*3/uL (ref 0.2–1.0)
Monocytes Relative: 8 %
NEUTROS PCT: 74 %
Neutro Abs: 6.2 10*3/uL (ref 1.4–6.5)

## 2017-06-22 LAB — URINALYSIS, COMPLETE (UACMP) WITH MICROSCOPIC
BILIRUBIN URINE: NEGATIVE
Bacteria, UA: NONE SEEN
GLUCOSE, UA: 50 mg/dL — AB
HGB URINE DIPSTICK: NEGATIVE
Ketones, ur: NEGATIVE mg/dL
Leukocytes, UA: NEGATIVE
NITRITE: NEGATIVE
PH: 6 (ref 5.0–8.0)
Protein, ur: 100 mg/dL — AB
SPECIFIC GRAVITY, URINE: 1.01 (ref 1.005–1.030)
Squamous Epithelial / LPF: NONE SEEN

## 2017-06-22 LAB — COMPREHENSIVE METABOLIC PANEL
ALK PHOS: 126 U/L (ref 38–126)
ALT: 21 U/L (ref 17–63)
ANION GAP: 15 (ref 5–15)
AST: 24 U/L (ref 15–41)
Albumin: 4.8 g/dL (ref 3.5–5.0)
BILIRUBIN TOTAL: 1.4 mg/dL — AB (ref 0.3–1.2)
BUN: 16 mg/dL (ref 6–20)
CALCIUM: 8.2 mg/dL — AB (ref 8.9–10.3)
CO2: 28 mmol/L (ref 22–32)
CREATININE: 1.3 mg/dL — AB (ref 0.61–1.24)
Chloride: 93 mmol/L — ABNORMAL LOW (ref 101–111)
GFR, EST AFRICAN AMERICAN: 59 mL/min — AB (ref >60)
GFR, EST NON AFRICAN AMERICAN: 51 mL/min — AB (ref >60)
Glucose, Bld: 201 mg/dL — ABNORMAL HIGH (ref 65–99)
Potassium: 4.4 mmol/L (ref 3.5–5.1)
SODIUM: 136 mmol/L (ref 135–145)
TOTAL PROTEIN: 8.9 g/dL — AB (ref 6.5–8.1)

## 2017-06-22 LAB — CBC
HCT: 43.4 % (ref 40.0–52.0)
Hemoglobin: 14.3 g/dL (ref 13.0–18.0)
MCH: 28.7 pg (ref 26.0–34.0)
MCHC: 32.8 g/dL (ref 32.0–36.0)
MCV: 87.6 fL (ref 80.0–100.0)
PLATELETS: 229 10*3/uL (ref 150–440)
RBC: 4.96 MIL/uL (ref 4.40–5.90)
RDW: 13.7 % (ref 11.5–14.5)
WBC: 8.3 10*3/uL (ref 3.8–10.6)

## 2017-06-22 LAB — APTT: aPTT: 31 seconds (ref 24–36)

## 2017-06-22 LAB — TROPONIN I

## 2017-06-22 LAB — PROTIME-INR
INR: 1.21
PROTHROMBIN TIME: 15.2 s (ref 11.4–15.2)

## 2017-06-22 LAB — AMMONIA: AMMONIA: 12 umol/L (ref 9–35)

## 2017-06-22 NOTE — ED Triage Notes (Signed)
[facility] Wife states that pt has altered mental status and has been having these "spells" since 2017. She states that he has episodes of flushing and chills, nausea x about a minute. He had a stroke in 2017. Pt has been having more episodes lately with short term memory loss in addition to the above symptoms. [facility] Pt had very bad headache Wednesday and Thursday of this week, and today presents confused and unable to remember what happened yesterday and today. Pt alert and able to answer questions, sometimes appropriately, sometimes not. NAD noted.

## 2017-06-22 NOTE — ED Provider Notes (Signed)
[facility] Emergency Department Provider Note    First MD Initiated Contact with Patient 06/22/17 1721     (approximate)  I have reviewed the triage vital signs and the nursing notes.   HISTORY  Chief Complaint Altered Mental Status    HPI Colin Stokes is a 81 y.o. male with complex past medical history presents with chief complaint of "spells " that consist of the patient becoming confused, reportedly diaphoretic nauseated and forgetful.  Wife states that she is witnessed several of these episodes since his stroke in 2017.  Said the patient was also having a headache that started 2 days ago.  They took a Tylenol.  Patient currently denies any headache.  No fevers.  Patient denies having these spells and thinks that he has been having lower blood sugars.  Denies any history of dementia.  He is currently being worked up for possible seizure episodes.  Presented for similar symptoms at the end of October and had an MRI on the 31st which showed expected changes status post CVA in 2017.  Denies any numbness or tingling.  No chest pain or palpitations.  Past Medical History:  Diagnosis Date  . Arthritis   . Asthma   . CHF (congestive heart failure) (HCC)   . Depression   . Diabetes mellitus without complication (HCC)   . Guillain Barr syndrome (HCC)   . Headache   . Hypertension   . Myocardial infarction (HCC)   . Pulmonary embolism (HCC)   . Sleep apnea   . Stroke [facility](HCC)    Family History  Problem Relation Age of Onset  . CAD Unknown    Past Surgical History:  Procedure Laterality Date  . APPENDECTOMY    . CATARACT EXTRACTION Right 03/02/2017  . CERVICAL SPINE SURGERY  2012   Patient Active Problem List   Diagnosis Date Noted  . Chronic systolic heart failure (HCC) 02/09/2017  . HTN (hypertension) 02/09/2017  . Guillain Barr syndrome (HCC) 02/09/2017  . Diabetes (HCC) 02/09/2017  . CIDP (chronic inflammatory demyelinating polyneuropathy)  (HCC) 09/28/2015      Prior to Admission medications   Medication Sig Start Date End Date Taking? Authorizing Provider  albuterol (PROVENTIL HFA;VENTOLIN HFA) 108 (90 Base) MCG/ACT inhaler Inhale 1 puff into the lungs every 6 (six) hours as needed for wheezing or shortness of breath.    [provider]  apixaban (ELIQUIS) 5 MG TABS tablet Take 5 mg by mouth 2 (two) times daily.    [provider]  atorvastatin (LIPITOR) 40 MG tablet Take 1 tablet (40 mg total) by mouth daily. 09/30/15   Ramonita LabGouru, Aruna, MD  budesonide-formoterol (SYMBICORT) 160-4.5 MCG/ACT inhaler Inhale 2 puffs into the lungs 2 (two) times daily.    [provider]  citalopram (CELEXA) 20 MG tablet Take 20 mg by mouth daily.    [provider]  clopidogrel (PLAVIX) 75 MG tablet Take 1 tablet (75 mg total) by mouth daily. 09/30/15   Ramonita LabGouru, Aruna, MD  colchicine 0.6 MG tablet Take 0.6 mg by mouth daily as needed. For gout flare-ups    [provider]  cyanocobalamin (,VITAMIN B-12,) 1000 MCG/ML injection Inject 1,000 mcg into the muscle every 30 (thirty) days.    [provider]  esomeprazole (NEXIUM) 40 MG capsule Take 40 mg by mouth daily at 12 noon.    [provider]  furosemide (LASIX) 40 MG tablet Take 1 tablet (40 mg total) by mouth daily. 01/10/17 01/10/18  Sharma CovertNorman,  Anne-Caroline, MD  gabapentin (NEURONTIN) 400 MG capsule Take 2 capsules (800 mg total) by mouth at bedtime. Patient taking differently: Take 300 mg by mouth 3 (three) times daily.  09/30/15   Gouru, Deanna ArtisAruna, MD  insulin NPH Human (HUMULIN N,NOVOLIN N) 100 UNIT/ML injection Inject 30-36 Units into the skin 2 (two) times daily. 30 unites every morning and 36 units at bedtime    [provider]  insulin regular (NOVOLIN R,HUMULIN R) 100 units/mL injection Inject into the skin 3 (three) times daily before meals. Per sliding scale    [provider]  LORazepam (ATIVAN) 0.5 MG tablet Take 0.5 mg  by mouth as needed for anxiety.    [provider]  magnesium oxide (MAG-OX) 400 MG tablet Take 400 mg by mouth 2 (two) times daily.     [provider]  metFORMIN (GLUCOPHAGE) 1000 MG tablet Take 1,000 mg by mouth 2 (two) times daily with a meal.    [provider]  metoprolol succinate (TOPROL-XL) 25 MG 24 hr tablet Take 25 mg by mouth daily.     [provider]  potassium chloride (K-DUR) 10 MEQ tablet Take 10 mEq by mouth daily.    [provider]  rOPINIRole (REQUIP) 0.25 MG tablet Take 0.25 mg by mouth 2 (two) times daily.    [provider]  sacubitril-valsartan (ENTRESTO) 49-51 MG Take 1 tablet by mouth 2 (two) times daily. 03/08/17   Delma FreezeHackney, Tina A, FNP  traZODone (DESYREL) 50 MG tablet Take 50 mg by mouth at bedtime.    [provider]  triamcinolone cream (KENALOG) 0.1 % Apply 1 application topically 2 (two) times daily.    [provider]    Allergies Penicillins    Social History Social History   Tobacco Use  . Smoking status: Former Games developermoker  . Smokeless tobacco: Never Used  Substance Use Topics  . Alcohol use: Yes    Alcohol/week: 0.0 oz    Comment: occasionally  . Drug use: No    Review of Systems Patient denies headaches, rhinorrhea, blurry vision, numbness, shortness of breath, chest pain, edema, cough, abdominal pain, nausea, vomiting, diarrhea, dysuria, fevers, rashes or hallucinations unless otherwise stated above in HPI. ____________________________________________   PHYSICAL EXAM:  VITAL SIGNS: Vitals:   06/22/17 1533  BP: 113/69  Pulse: 91  Resp: 18  Temp: 98.9 F (37.2 C)  SpO2: 93%    Constitutional: Alert and oriented. Well appearing and in no acute distress. Eyes: Conjunctivae are normal.  Head: Atraumatic. Nose: No congestion/rhinnorhea. Mouth/Throat: Mucous membranes are moist.   Neck: No stridor. Painless ROM.  Cardiovascular: Normal rate, regular rhythm. Grossly  normal heart sounds.  Good peripheral circulation. Respiratory: Normal respiratory effort.  No retractions. Lungs CTAB. Gastrointestinal: Soft and nontender. No distention. No abdominal bruits. No CVA tenderness. Musculoskeletal: No lower extremity tenderness nor edema.  No joint effusions. Neurologic:  Normal speech and language. No gross focal neurologic deficits are appreciated. No facial droop Skin:  Skin is warm, dry and intact. No rash noted. Psychiatric: Mood and affect are normal. Speech and behavior are normal.  ____________________________________________   LABS (all labs ordered are listed, but only abnormal results are displayed)  Results for orders placed or performed during the hospital encounter of 06/22/17 (from the past 24 hour(s))  Protime-INR     Status: None   Collection Time: 06/22/17  3:46 PM  Result Value Ref Range   Prothrombin Time 15.2 11.4 - 15.2 seconds   INR 1.21  APTT     Status: None   Collection Time: 06/22/17  3:46 PM  Result Value Ref Range   aPTT 31 24 - 36 seconds  CBC     Status: None   Collection Time: 06/22/17  3:46 PM  Result Value Ref Range   WBC 8.3 3.8 - 10.6 K/uL   RBC 4.96 4.40 - 5.90 MIL/uL   Hemoglobin 14.3 13.0 - 18.0 g/dL   HCT 16.1 09.6 - 04.5 %   MCV 87.6 80.0 - 100.0 fL   MCH 28.7 26.0 - 34.0 pg   MCHC 32.8 32.0 - 36.0 g/dL   RDW 40.9 81.1 - 91.4 %   Platelets 229 150 - 440 K/uL  Differential     Status: None   Collection Time: 06/22/17  3:46 PM  Result Value Ref Range   Neutrophils Relative % 74 %   Neutro Abs 6.2 1.4 - 6.5 K/uL   Lymphocytes Relative 16 %   Lymphs Abs 1.3 1.0 - 3.6 K/uL   Monocytes Relative 8 %   Monocytes Absolute 0.7 0.2 - 1.0 K/uL   Eosinophils Relative 1 %   Eosinophils Absolute 0.1 0 - 0.7 K/uL   Basophils Relative 1 %   Basophils Absolute 0.1 0 - 0.1 K/uL  Comprehensive metabolic panel     Status: Abnormal   Collection Time: 06/22/17  3:46 PM  Result Value Ref Range   Sodium 136 135 - 145  mmol/L   Potassium 4.4 3.5 - 5.1 mmol/L   Chloride 93 (L) 101 - 111 mmol/L   CO2 28 22 - 32 mmol/L   Glucose, Bld 201 (H) 65 - 99 mg/dL   BUN 16 6 - 20 mg/dL   Creatinine, Ser 7.82 (H) 0.61 - 1.24 mg/dL   Calcium 8.2 (L) 8.9 - 10.3 mg/dL   Total Protein 8.9 (H) 6.5 - 8.1 g/dL   Albumin 4.8 3.5 - 5.0 g/dL   AST 24 15 - 41 U/L   ALT 21 17 - 63 U/L   Alkaline Phosphatase 126 38 - 126 U/L   Total Bilirubin 1.4 (H) 0.3 - 1.2 mg/dL   GFR calc non Af Amer 51 (L) >60 mL/min   GFR calc Af Amer 59 (L) >60 mL/min   Anion gap 15 5 - 15  Troponin I     Status: None   Collection Time: 06/22/17  3:46 PM  Result Value Ref Range   Troponin I <0.03 <0.03 ng/mL   ____________________________________________  EKG My review and personal interpretation at Time: 15:31   Indication: confusion  Rate: 85  Rhythm: sinus Axis: normal Other: poor rwave progression, normal intervals, no stemi ____________________________________________  RADIOLOGY  I personally reviewed all radiographic images ordered to evaluate for the above acute complaints and reviewed radiology reports and findings.  These findings were personally discussed with the patient.  Please see medical record for radiology report.  ____________________________________________   PROCEDURES  Procedure(s) performed:  Procedures    Critical Care performed: no ____________________________________________   INITIAL IMPRESSION / ASSESSMENT AND PLAN / ED COURSE  Pertinent labs & imaging results that were available during my care of the patient were reviewed by me and considered in my medical decision making (see chart for details).  DDX: Dehydration, sepsis, pna, uti, hypoglycemia, cva, drug effect, withdrawal, encephalitis   JERRARD BRADBURN is a 81 y.o. who presents to the ED with above "spell" since last year.  Patient arrives afebrile and hemodynamically stable he has no focal neuro  deficits at this time.  CT head shows no  evidence of bleed, mass or new ischemia.  Does have chronic encephalomalacia malacia secondary to previous stroke.  He is medically optimized currently on anticoagulation and is followed by Dr. Clelia Croft with neurology.  Blood work otherwise is reassuring.  Anabolic encephalopathy.  Patient is forgetful but does not seem to be demonstrating any evidence of acute CVA or acute stroke.  Dr. Clelia Croft is currently agreed to follow him up in clinic on Monday.  Discussed this with patient and family.  Have discussed with the patient and available family all diagnostics and treatments performed thus far and all questions were answered to the best of my ability. The patient demonstrates understanding and agreement with plan.       ____________________________________________   FINAL CLINICAL IMPRESSION(S) / ED DIAGNOSES  Final diagnoses:  Confusion      NEW MEDICATIONS STARTED DURING THIS VISIT:  This SmartLink is deprecated. Use AVSMEDLIST instead to display the medication list for a patient.   Note:  This document was prepared using Dragon voice recognition software and may include unintentional dictation errors.    Willy Eddy, MD 06/22/17 [phone]

## 2017-06-25 LAB — BLOOD GAS, VENOUS
Acid-Base Excess: 5.7 mmol/L — ABNORMAL HIGH (ref 0.0–2.0)
BICARBONATE: 33.1 mmol/L — AB (ref 20.0–28.0)
PATIENT TEMPERATURE: 37
PCO2 VEN: 60 mmHg (ref 44.0–60.0)
pH, Ven: 7.35 (ref 7.250–7.430)

## 2017-08-09 ENCOUNTER — Other Ambulatory Visit: Payer: Self-pay | Admitting: Family

## 2017-08-09 MED ORDER — SACUBITRIL-VALSARTAN 49-51 MG PO TABS: 1.0000 | ORAL_TABLET | Freq: Two times a day (BID) | ORAL | 5 refills | Status: DC

## 2017-10-04 ENCOUNTER — Ambulatory Visit: Payer: Medicare Other | Admitting: Family

## 2017-11-23 NOTE — Progress Notes (Signed)
Patient ID: Colin Stokes, male    DOB: [birth year], 82 y.o.   MRN: [id number]  HPI  Colin Stokes is a 82 y/o male with a history of obstructive sleep apnea, pulmonary embolus, CVA, MI, HTN, guillain barre, diabetes, depression, asthma, previous tobacco use and chronic heart failure.   Echo report on 10/09/16 reviewed and showed an EF of 30% along with moderate Colin. Reviewed echo done on 09/30/15 which showed an EF of 30-35% along with trivial Colin/TR.   Was in the ED 11/19/17 due to syncope where he was treated and released with neurology follow-up. Was in the ED 10/19/17 due to a fall where he was treated and released. Was in the ED 06/22/17 due to confusion where he was evaluated and released with neurology follow-up.   He presents today for his follow-up visit with a chief complaint of minimal shortness of breath upon moderate exertion. He describes this as chronic in nature having been present for several years. He does have associated fatigue, weakness in legs, numbness in lower legs and depression. He denies any difficulty sleeping, abdominal distention, palpitations, edema, chest pain, dizziness, cough or weight gain. In fact, he's lost >30 pounds since he was last here due to strict diet his endocrinologist put him on. Since he was here last, he has moved to [facility] and is trying to get most of his medical care transferred up there.   Past Medical History:  Diagnosis Date  . Arthritis   . Asthma   . CHF (congestive heart failure) (HCC)   . Depression   . Diabetes mellitus without complication (HCC)   . Guillain Barr syndrome (HCC)   . Headache   . Hypertension   . Myocardial infarction (HCC)   . Pulmonary embolism (HCC)   . Sleep apnea   . Stroke [facility])    Past Surgical History:  Procedure Laterality Date  . APPENDECTOMY    . CATARACT EXTRACTION Right 03/02/2017  . CERVICAL SPINE SURGERY  2012   Family History  Problem Relation Age of Onset  . CAD Unknown    Social History    Tobacco Use  . Smoking status: Former Games developer  . Smokeless tobacco: Never Used  Substance Use Topics  . Alcohol use: Yes    Alcohol/week: 0.0 oz    Comment: occasionally   Allergies  Allergen Reactions  . Penicillins Rash    Has patient had a PCN reaction causing immediate rash, facial/tongue/throat swelling, SOB or lightheadedness with hypotension: No Has patient had a PCN reaction causing severe rash involving mucus membranes or skin necrosis: No Has patient had a PCN reaction that required hospitalization: No Has patient had a PCN reaction occurring within the last 10 years: No If all of the above answers are "NO", then may proceed with Cephalosporin use.   Prior to Admission medications   Medication Sig Start Date End Date Taking? Authorizing Provider  albuterol (PROVENTIL HFA;VENTOLIN HFA) 108 (90 Base) MCG/ACT inhaler Inhale 1 puff into the lungs every 6 (six) hours as needed for wheezing or shortness of breath.   Yes [provider]  apixaban (ELIQUIS) 5 MG TABS tablet Take 1 tablet (5 mg total) by mouth 2 (two) times daily. 11/26/17  Yes Clarisa Kindred A, FNP  atorvastatin (LIPITOR) 40 MG tablet Take 1 tablet (40 mg total) by mouth daily. 09/30/15  Yes Gouru, Deanna Artis, MD  budesonide-formoterol (SYMBICORT) 160-4.5 MCG/ACT inhaler Inhale 2 puffs into the lungs 2 (two) times daily.   Yes [provider]  citalopram (CELEXA) 20 MG tablet Take 20 mg by mouth daily.   Yes [provider]  clopidogrel (PLAVIX) 75 MG tablet Take 1 tablet (75 mg total) by mouth daily. 09/30/15  Yes Gouru, Deanna Artis, MD  colchicine 0.6 MG tablet Take 0.6 mg by mouth daily as needed. For gout flare-ups   Yes [provider]  cyanocobalamin (,VITAMIN B-12,) 1000 MCG/ML injection Inject 1,000 mcg into the muscle every 30 (thirty) days.   Yes [provider]  esomeprazole (NEXIUM) 40 MG capsule Take 40 mg by mouth daily at 12 noon.   Yes [provider]  furosemide  (LASIX) 20 MG tablet Take 20 mg by mouth daily.   Yes [provider]  LORazepam (ATIVAN) 0.5 MG tablet Take 0.5 mg by mouth as needed for anxiety.   Yes [provider]  magnesium oxide (MAG-OX) 400 MG tablet Take 400 mg by mouth 2 (two) times daily.    Yes [provider]  metFORMIN (GLUCOPHAGE) 1000 MG tablet Take 1,000 mg by mouth 2 (two) times daily with a meal.   Yes [provider]  metoprolol succinate (TOPROL-XL) 25 MG 24 hr tablet Take 25 mg by mouth daily.    Yes [provider]  rOPINIRole (REQUIP) 0.25 MG tablet Take 0.25 mg by mouth 2 (two) times daily.   Yes [provider]  sacubitril-valsartan (ENTRESTO) 49-51 MG Take 1 tablet by mouth 2 (two) times daily. 11/26/17  Yes Clarisa Kindred A, FNP  triamcinolone cream (KENALOG) 0.1 % Apply 1 application topically 2 (two) times daily.   Yes [provider]  insulin NPH Human (HUMULIN N,NOVOLIN N) 100 UNIT/ML injection Inject 30-36 Units into the skin 2 (two) times daily. 30 unites every morning and 36 units at bedtime    [provider]  insulin regular (NOVOLIN R,HUMULIN R) 100 units/mL injection Inject into the skin 3 (three) times daily before meals. Per sliding scale    [provider]    Review of Systems  Constitutional: Positive for fatigue. Negative for appetite change.  HENT: Negative for congestion, postnasal drip and sore throat.   Eyes: Positive for visual disturbance (decreased vision since stroke). Negative for pain.  Respiratory: Positive for shortness of breath (minimal). Negative for cough, chest tightness and wheezing.   Cardiovascular: Negative for chest pain, palpitations and leg swelling.  Gastrointestinal: Negative for abdominal distention, abdominal pain and nausea.  Endocrine: Negative.   Genitourinary: Negative.   Musculoskeletal: Positive for arthralgias (restless legs at times). Negative for back pain.  Skin: Negative.    Allergic/Immunologic: Negative.   Neurological: Positive for weakness and numbness (bilateral lower legs). Negative for dizziness.  Hematological: Negative for adenopathy. Does not bruise/bleed easily.  Psychiatric/Behavioral: Positive for dysphoric mood. Negative for sleep disturbance (sleep in a recliner; wears oxygen at 2L around the clock). The patient is not nervous/anxious.    Vitals:   11/26/17 1215  BP: 121/66  Pulse: 81  Resp: 18  SpO2: 100%  Weight: 192 lb 8 oz (87.3 kg)  Height: 5\' 11"  (1.803 m)   Wt Readings from Last 3 Encounters:  11/26/17 192 lb 8 oz (87.3 kg)  06/22/17 216 lb (98 kg)  04/05/17 224 lb 6 oz (101.8 kg)   Lab Results  Component Value Date   CREATININE 1.30 (H) 06/22/2017   CREATININE 1.49 (H) 04/05/2017   CREATININE 0.93 01/10/2017    Physical Exam  Constitutional: He is oriented to person, place, and time. He appears  well-developed and well-nourished.  HENT:  Head: Normocephalic and atraumatic.  Neck: Normal range of motion. Neck supple. No JVD present.  Cardiovascular: Normal rate and regular rhythm.  Pulmonary/Chest: Effort normal. He has no wheezes. He has no rales.  Abdominal: Soft. He exhibits no distension. There is no tenderness.  Musculoskeletal: He exhibits no edema or tenderness.  Neurological: He is alert and oriented to person, place, and time.  Skin: Skin is warm and dry.  Psychiatric: He has a normal mood and affect. His behavior is normal. Thought content normal.  Nursing note and vitals reviewed.   Assessment & Plan:  1: Chronic heart failure with reduced ejection fraction- - NYHA class II - euvolemic today - weighing daily and he was instructed to call for an overnight weight gain of >2 pounds or a weekly weight gain of >5 pounds - weight down 31.8 pounds since he was last here August 2018 - not adding salt to his food and he has been reading food labels  - tries to remain active and is hoping to resume golfing now that  the weather has improved - wears oxygen at 2L mostly around the clock  - has tolerated entresto BID without known side effects - saw cardiologist (Paraschos) 09/03/17 - BNP on 01/10/17 was 583.0  2: HTN- - BP looks good today - saw PCP ([place]) 11/08/17 - BMP on 11/23/17 reviewed and showed sodium 139, potassium 4.7 and GFR 53  3: Guillain Barre`- - diagnosed in 2013 and was paralyzed from the neck down for 9 months - has chronic weakness and numbness in bilateral lower legs - saw neurologist Theresia Majors([place]) 11/09/17 - receiving PT  4: Diabetes- - saw endocrinologist Renae Fickle([place]) 11/23/17 - A1c 11/23/17 was 7.6% - fasting glucose this morning was 179 and nonfasting glucose was 224. He only takes the insulin if his glucose gets >250  Medication list was reviewed.  [place], we have opted to not make a return appointment at this time. Advised both he and his wife that they could call back at any time to make another appointment.

## 2017-11-26 ENCOUNTER — Encounter: Payer: Self-pay | Admitting: Family

## 2017-11-26 ENCOUNTER — Ambulatory Visit: Payer: Medicare Other | Attending: Family | Admitting: Family

## 2017-11-26 VITALS — BP 121/66 | HR 81 | Resp 18 | Ht 71.0 in | Wt 192.5 lb

## 2017-11-26 DIAGNOSIS — Z8249 Family history of ischemic heart disease and other diseases of the circulatory system: Secondary | ICD-10-CM | POA: Diagnosis not present

## 2017-11-26 DIAGNOSIS — Z86711 Personal history of pulmonary embolism: Secondary | ICD-10-CM | POA: Insufficient documentation

## 2017-11-26 DIAGNOSIS — Z79899 Other long term (current) drug therapy: Secondary | ICD-10-CM | POA: Diagnosis not present

## 2017-11-26 DIAGNOSIS — Z7901 Long term (current) use of anticoagulants: Secondary | ICD-10-CM | POA: Diagnosis not present

## 2017-11-26 DIAGNOSIS — G610 Guillain-Barre syndrome: Secondary | ICD-10-CM

## 2017-11-26 DIAGNOSIS — E119 Type 2 diabetes mellitus without complications: Secondary | ICD-10-CM | POA: Diagnosis not present

## 2017-11-26 DIAGNOSIS — Z880 Allergy status to penicillin: Secondary | ICD-10-CM | POA: Insufficient documentation

## 2017-11-26 DIAGNOSIS — R531 Weakness: Secondary | ICD-10-CM | POA: Insufficient documentation

## 2017-11-26 DIAGNOSIS — I252 Old myocardial infarction: Secondary | ICD-10-CM | POA: Diagnosis not present

## 2017-11-26 DIAGNOSIS — I5022 Chronic systolic (congestive) heart failure: Secondary | ICD-10-CM

## 2017-11-26 DIAGNOSIS — Z794 Long term (current) use of insulin: Secondary | ICD-10-CM | POA: Insufficient documentation

## 2017-11-26 DIAGNOSIS — Z87891 Personal history of nicotine dependence: Secondary | ICD-10-CM | POA: Diagnosis not present

## 2017-11-26 DIAGNOSIS — I10 Essential (primary) hypertension: Secondary | ICD-10-CM

## 2017-11-26 DIAGNOSIS — F329 Major depressive disorder, single episode, unspecified: Secondary | ICD-10-CM | POA: Insufficient documentation

## 2017-11-26 DIAGNOSIS — Z7902 Long term (current) use of antithrombotics/antiplatelets: Secondary | ICD-10-CM | POA: Insufficient documentation

## 2017-11-26 DIAGNOSIS — I110 Hypertensive heart disease with heart failure: Secondary | ICD-10-CM | POA: Insufficient documentation

## 2017-11-26 DIAGNOSIS — Z8673 Personal history of transient ischemic attack (TIA), and cerebral infarction without residual deficits: Secondary | ICD-10-CM | POA: Diagnosis not present

## 2017-11-26 DIAGNOSIS — G4733 Obstructive sleep apnea (adult) (pediatric): Secondary | ICD-10-CM | POA: Insufficient documentation

## 2017-11-26 DIAGNOSIS — Z9889 Other specified postprocedural states: Secondary | ICD-10-CM | POA: Diagnosis not present

## 2017-11-26 DIAGNOSIS — J45909 Unspecified asthma, uncomplicated: Secondary | ICD-10-CM | POA: Insufficient documentation

## 2017-11-26 MED ORDER — SACUBITRIL-VALSARTAN 49-51 MG PO TABS: 1.0000 | ORAL_TABLET | Freq: Two times a day (BID) | ORAL | 5 refills | Status: AC

## 2017-11-26 MED ORDER — APIXABAN 5 MG PO TABS: 5.0000 mg | ORAL_TABLET | Freq: Two times a day (BID) | ORAL | 5 refills | Status: AC

## 2017-11-26 NOTE — Patient Instructions (Signed)
Continue weighing daily and call for an overnight weight gain of > 2 pounds or a weekly weight gain of >5 pounds. 

## 2018-01-10 ENCOUNTER — Telehealth: Payer: Self-pay | Admitting: Family

## 2018-01-10 NOTE — Telephone Encounter (Signed)
Mrs. Mceachron called to say that she needed a new RX for the decreased dose of entresto that patient has been taking. She says that he's had a few hospitalizations this last month due to falls, low blood pressure and "spells". She says that his entresto dose has been decreased to the 24/26mg  dose but that she's been breaking his 49/51 tablets in 1/2 and giving him 1/2 tablet twice daily.  Patient is current receiving the entresto through [facility] so will fax a new RX for entresto 24/26mg  twice daily to [facility] so that they can ship the decreased dosage to patient.

## 2019-01-28 IMAGING — MR MR HEAD W/O CM
10 series · 48 of 48 positions shown · non-contrast
Comparison: CT head 10/03/2015. CTA head and neck 09/29/2015. Brain
MRI 09/29/2015.

CLINICAL DATA: 72-year-old male with increasing frequency of
episodes of weakness and disorientation. Right PCA infarct in 4345.

EXAM:
MRI HEAD WITHOUT CONTRAST
TECHNIQUE: Multiplanar, multiecho pulse sequences of the brain and surrounding
structures were obtained without intravenous contrast.

[Series 2: T1 · sagittal · 5.0mm · 0.45mm/px · 3 of 27 slices shown (1 of 2)]
[im 1/27]
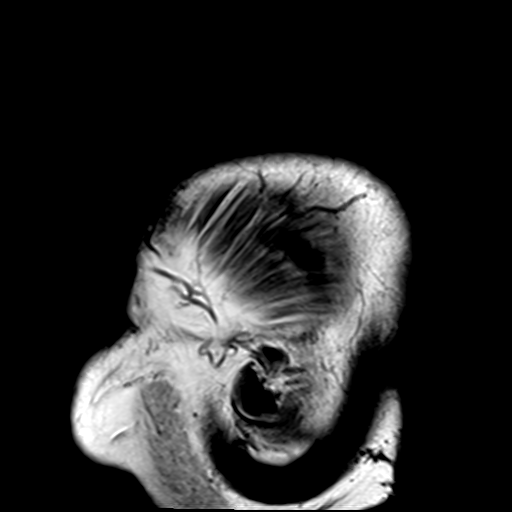
[im 14/27]
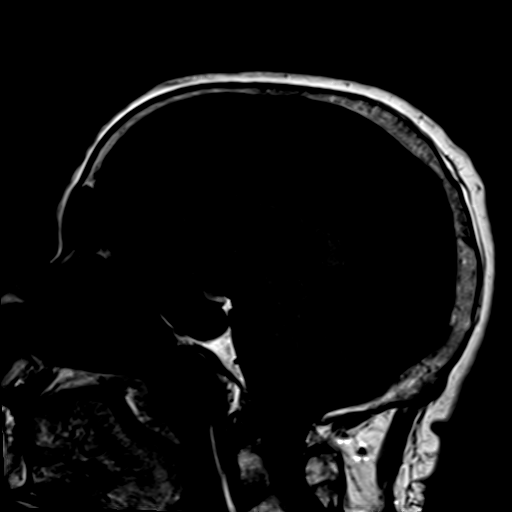
[im 27/27]
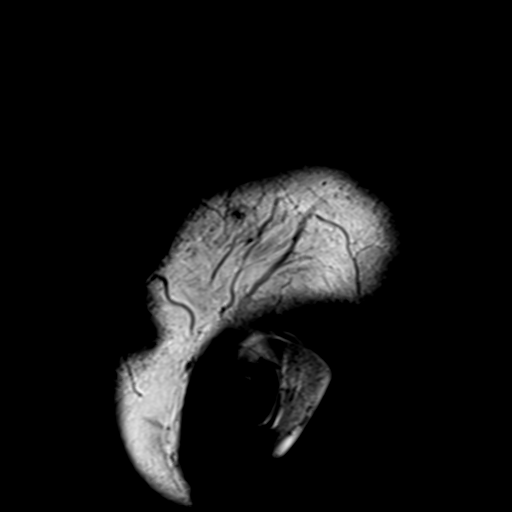

[Series 4: DWI · axial · 3.0mm · 1.80mm/px · z∈[-20,+137]mm · 5 of 55 slices shown (1 of 2)]
[im 1/55]
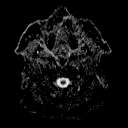
[im 14/55]
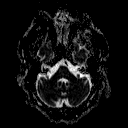
[im 28/55]
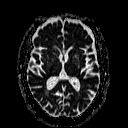
[im 41/55]
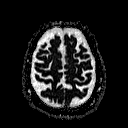
[im 55/55]
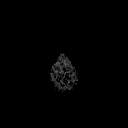

[Series 6: DWI · coronal · 3.0mm · 1.80mm/px · 4 of 45 slices shown (2 of 2)]
[im 1/45]
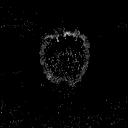
[im 15/45]
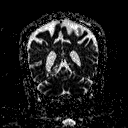
[im 30/45]
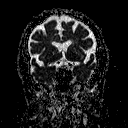
[im 45/45]
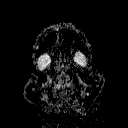

[Series 7: T2 · axial · 5.0mm · 0.60mm/px · z∈[-16,+135]mm · 2 of 25 slices shown (1 of 3)]
[im 1/25]
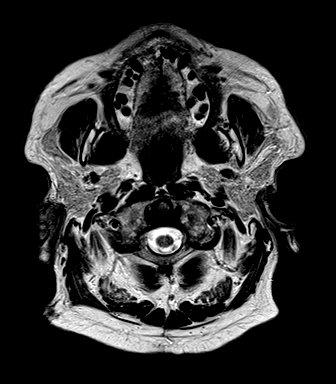
[im 25/25]
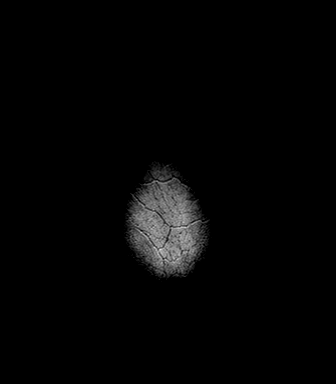

[Series 8: FLAIR · axial · 3.0mm · 0.45mm/px · z∈[-16,+135]mm · 5 of 53 slices shown]
[im 1/53]
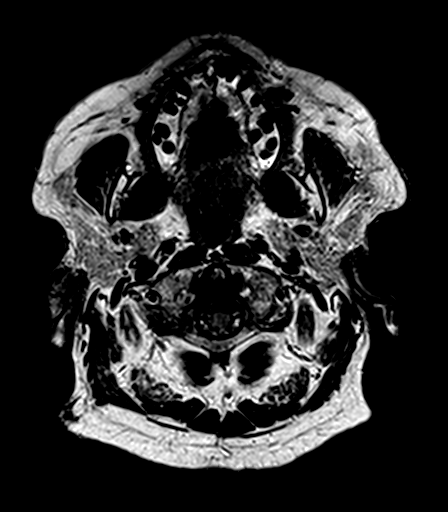
[im 14/53]
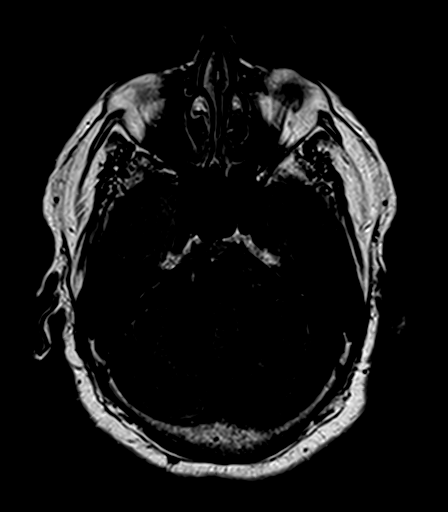
[im 27/53]
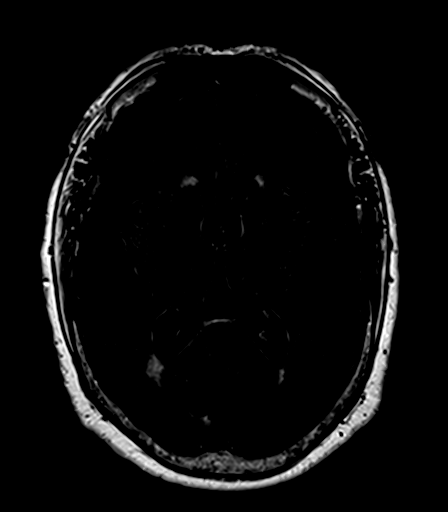
[im 40/53]
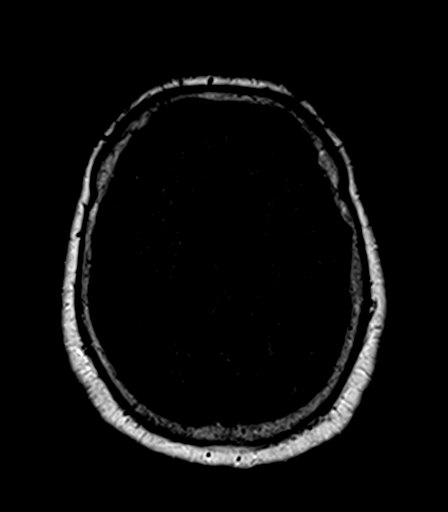
[im 53/53]
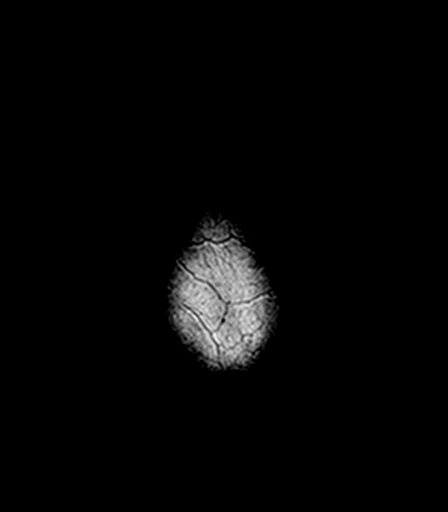

[Series 9: T2 · axial · 5.0mm · 0.45mm/px · z∈[-16,+135]mm · 2 of 25 slices shown (2 of 3)]
[im 1/25]
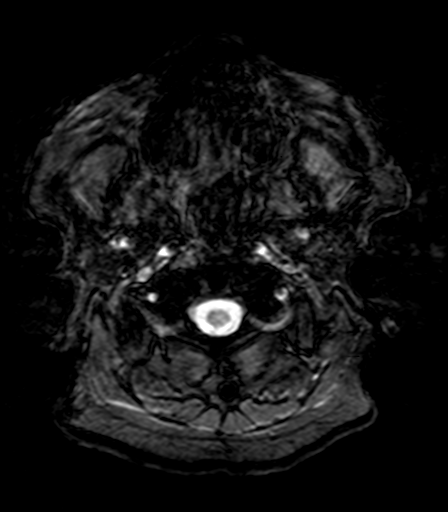
[im 25/25]
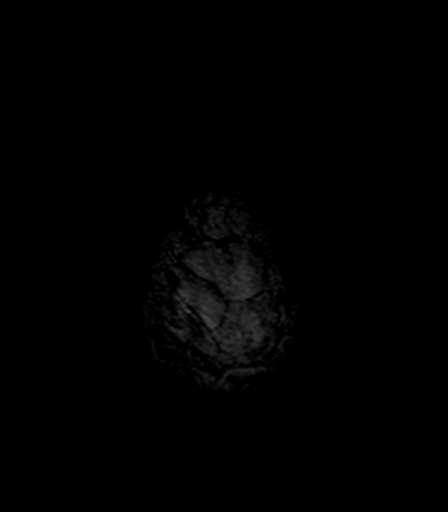

[Series 10: T1 · axial · 1.0mm · 1.00mm/px · z∈[-22,+148]mm · 16 of 176 slices shown (2 of 2)]
[im 1/176]
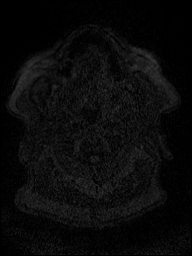
[im 12/176]
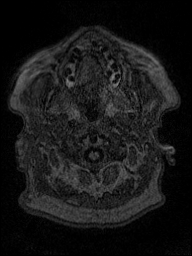
[im 24/176]
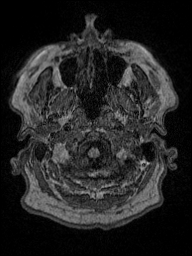
[im 36/176]
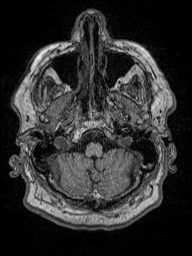
[im 47/176]
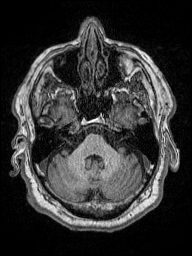
[im 59/176]
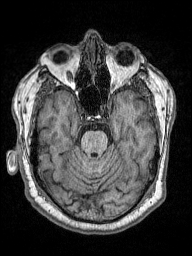
[im 71/176]
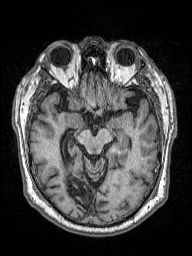
[im 82/176]
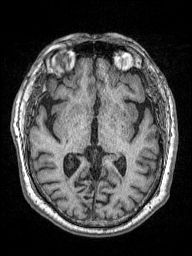
[im 94/176]
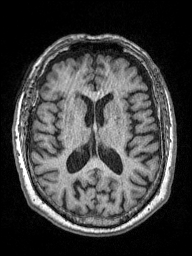
[im 106/176]
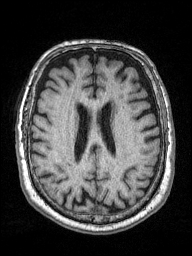
[im 117/176]
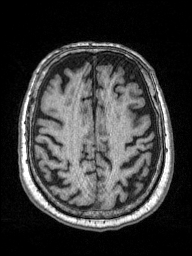
[im 129/176]
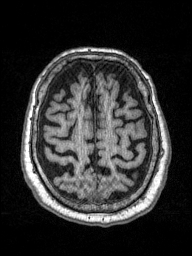
[im 141/176]
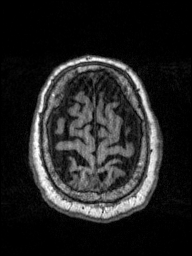
[im 152/176]
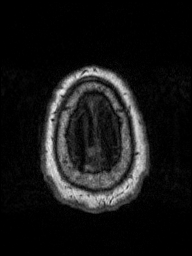
[im 164/176]
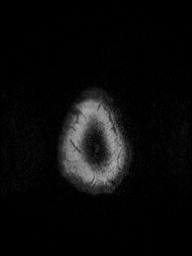
[im 176/176]
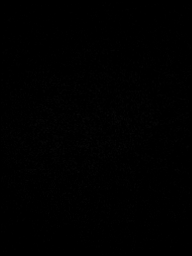

[Series 11: T2 · coronal · 5.0mm · 0.49mm/px · 2 of 27 slices shown (3 of 3)]
[im 1/27]
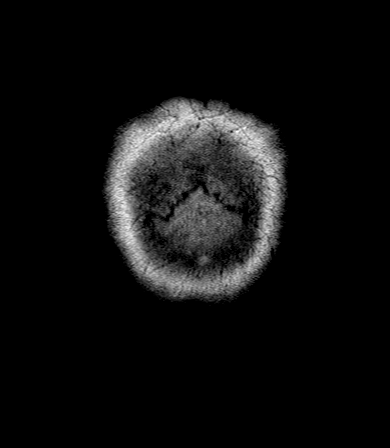
[im 27/27]
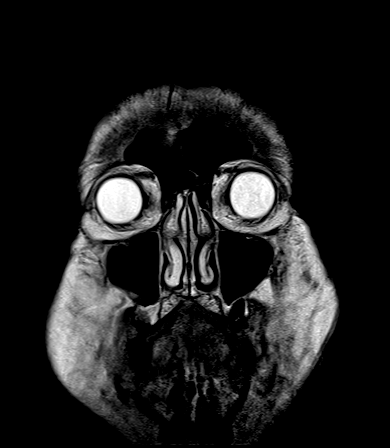

[Series 100: (id) · axial · 3.0mm · 1.80mm/px · z∈[-20,+137]mm · 5 of 55 slices shown]
[im 1/55]
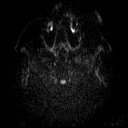
[im 14/55]
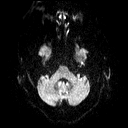
[im 28/55]
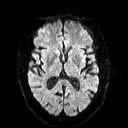
[im 41/55]
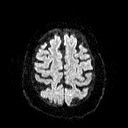
[im 55/55]
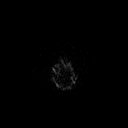

[Series 101: (id) cor · coronal · 3.0mm · 1.80mm/px · 4 of 45 slices shown]
[im 1/45]
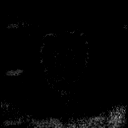
[im 15/45]
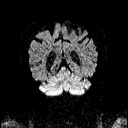
[im 30/45]
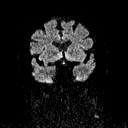
[im 45/45]
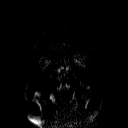

[48 of 48 positions shown; findings below may reference images not displayed]

FINDINGS: Brain: Encephalomalacia with laminar necrosis in the right PCA
territory which was infarcted in 4345, including the medial and
inferior right occipital lobe and posterior mesial temporal lobe.
Trace associated hemosiderin.

No restricted diffusion to suggest acute infarction. No midline
shift, mass effect, evidence of mass lesion, ventriculomegaly,
extra-axial collection or acute intracranial hemorrhage.
Cervicomedullary junction and pituitary are within normal limits.

Outside of the right PCA territory minimal to mild nonspecific
scattered cerebral white matter T2 and FLAIR hyperintensity. No
other cortical encephalomalacia. No other chronic cerebral blood
products. Mild T2 heterogeneity in the basal ganglia. Thalami,
brainstem, and cerebellum remain normal for age.

Vascular: Major intracranial vascular flow voids are stable.

Skull and upper cervical spine: Negative. Normal bone marrow signal.

Sinuses/Orbits: Interval postoperative changes to the right globe,
otherwise negative orbits soft tissues. Paranasal sinuses remain
clear.

Other: Mild right mastoid effusion is new since 4345, probably
postinflammatory. Other Visible internal auditory structures appear
normal. Negative nasopharynx. Scalp and face soft tissues appear
negative.
IMPRESSION: 1. Expected evolution of the 4345 right PCA territory infarct.
2. Minimal to mild for age signal changes elsewhere in the brain,
nonspecific but most often due to chronic small vessel disease.
3.  No acute intracranial abnormality.
